# Patient Record
Sex: Female | Born: 1980 | Race: White | Hispanic: No | Marital: Single | Smoking: Current every day smoker
Health system: Southern US, Community
[De-identification: ages and names within clinical notes are randomized; demographics above are authoritative.]

## PROBLEM LIST (undated history)

## (undated) ENCOUNTER — Inpatient Hospital Stay: Payer: Self-pay

## (undated) DIAGNOSIS — Z6711 Type A blood, Rh negative: Secondary | ICD-10-CM

## (undated) DIAGNOSIS — R87611 Atypical squamous cells cannot exclude high grade squamous intraepithelial lesion on cytologic smear of cervix (ASC-H): Secondary | ICD-10-CM

## (undated) DIAGNOSIS — R87619 Unspecified abnormal cytological findings in specimens from cervix uteri: Secondary | ICD-10-CM

## (undated) DIAGNOSIS — Z6831 Body mass index (BMI) 31.0-31.9, adult: Secondary | ICD-10-CM

## (undated) DIAGNOSIS — D069 Carcinoma in situ of cervix, unspecified: Secondary | ICD-10-CM

## (undated) HISTORY — DX: Unspecified abnormal cytological findings in specimens from cervix uteri: R87.619

## (undated) HISTORY — DX: Carcinoma in situ of cervix, unspecified: D06.9

## (undated) HISTORY — DX: Body mass index (BMI) 31.0-31.9, adult: Z68.31

## (undated) HISTORY — DX: Type A blood, Rh negative: Z67.11

## (undated) HISTORY — DX: Atypical squamous cells cannot exclude high grade squamous intraepithelial lesion on cytologic smear of cervix (ASC-H): R87.611

---

## 2006-05-17 ENCOUNTER — Ambulatory Visit: Payer: Self-pay | Admitting: Family Medicine

## 2006-05-27 ENCOUNTER — Ambulatory Visit: Payer: Self-pay | Admitting: Family Medicine

## 2006-11-04 IMAGING — US US THYROID
1 series · 18 of 25 positions shown · non-contrast
Comparison: none

REASON FOR EXAM: hypomegaly
COMMENTS:

[Series 1: us thyroid · 18 of 28 slices shown]
[im 1/28]
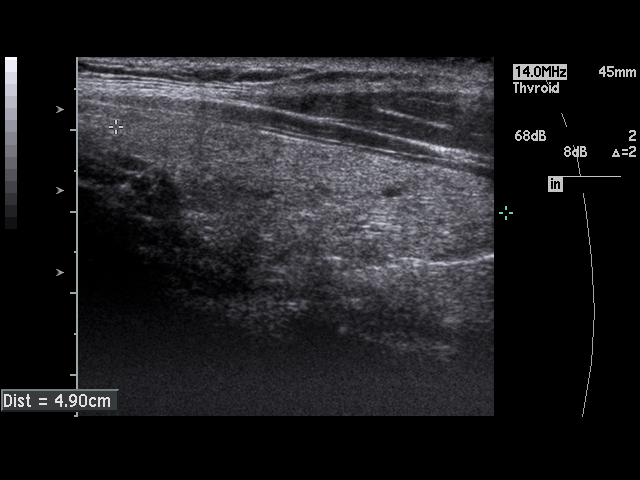
[im 3/28]
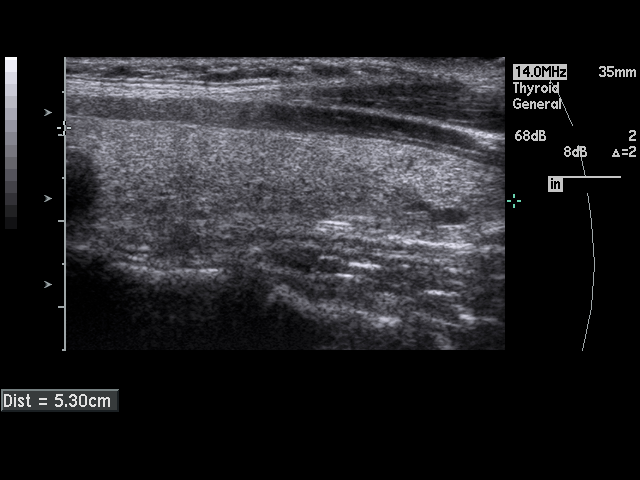
[im 4/28]
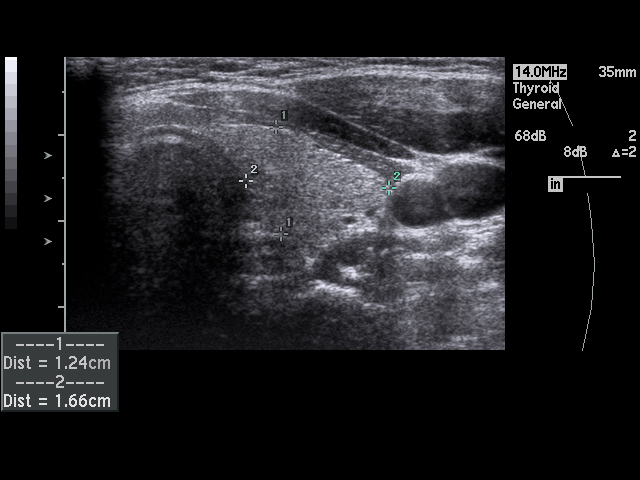
[im 5/28]
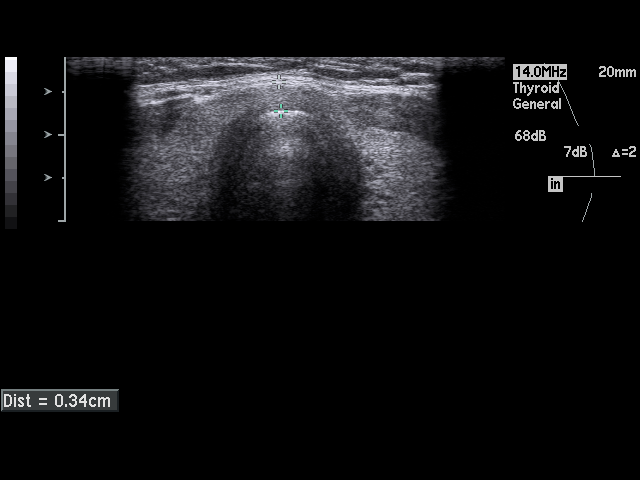
[im 7/28]
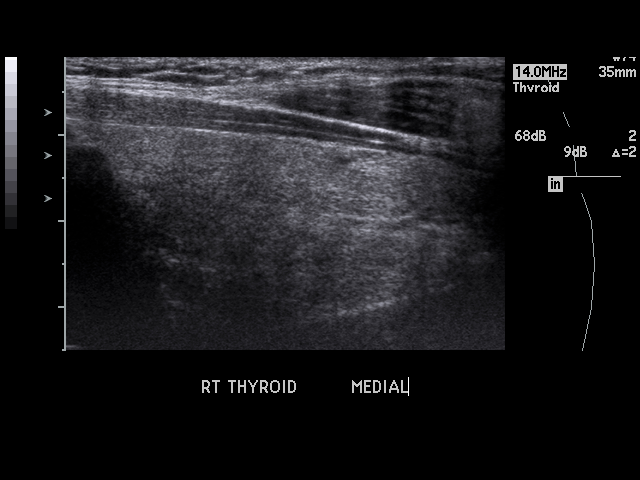
[im 8/28]
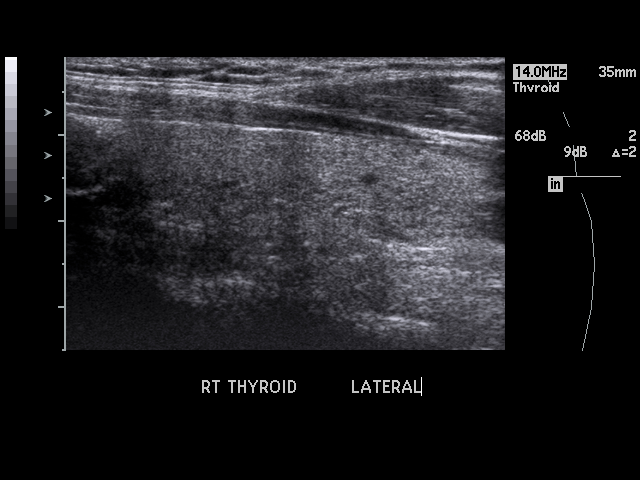
[im 11/28]
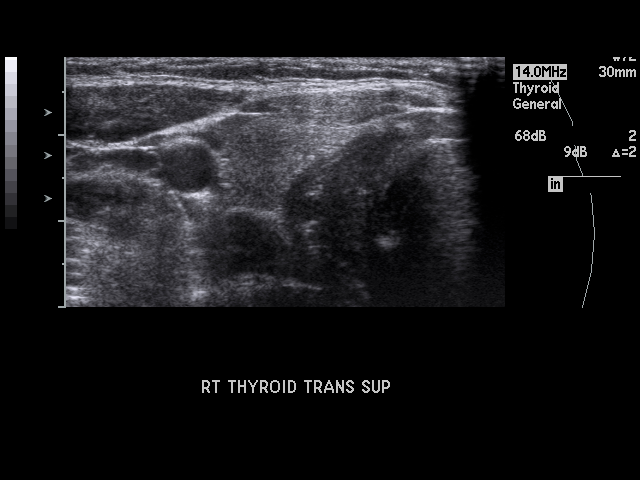
[im 12/28]
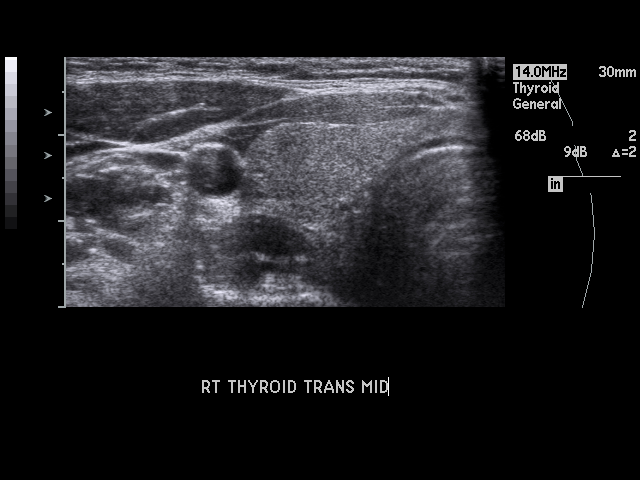
[im 13/28]
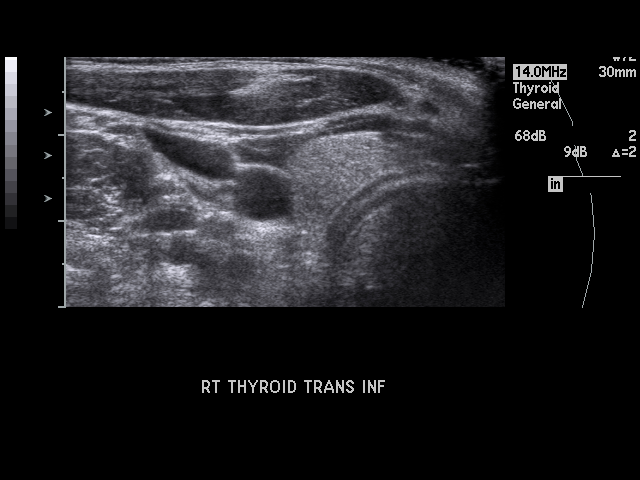
[im 15/28]
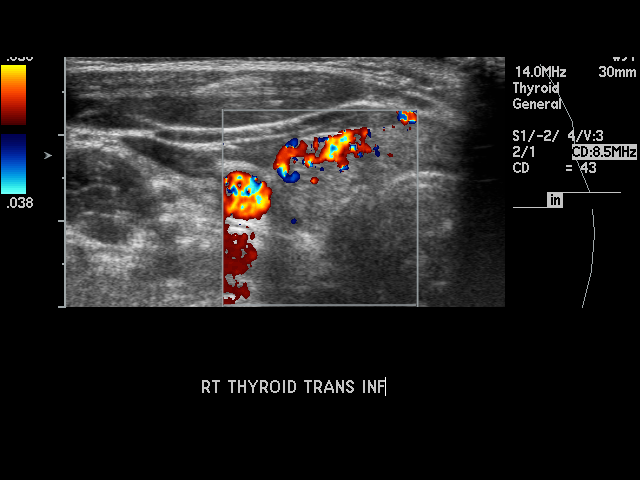
[im 16/28]
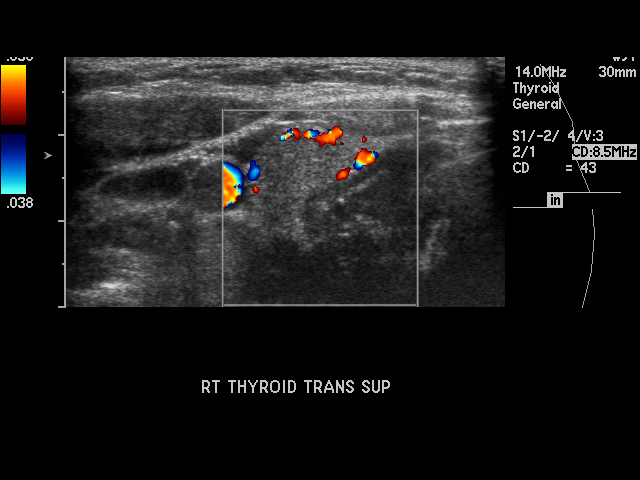
[im 17/28]
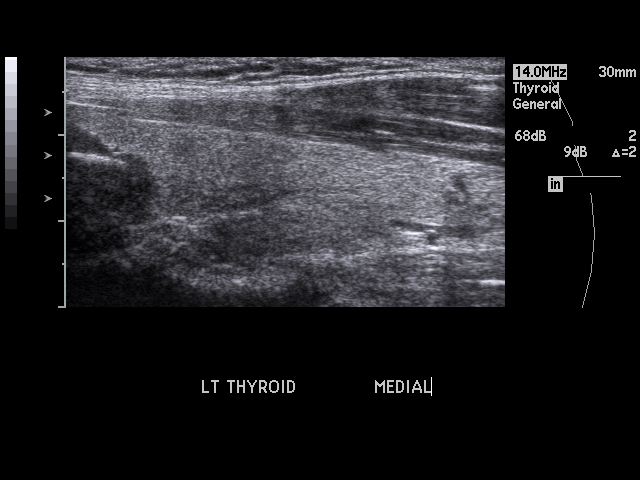
[im 20/28]
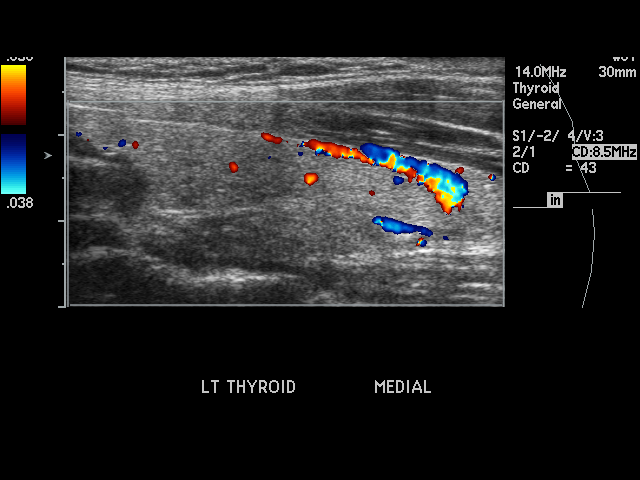
[im 21/28]
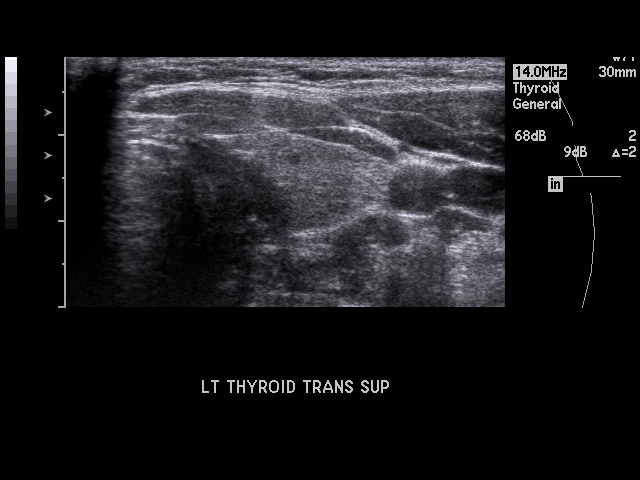
[im 23/28]
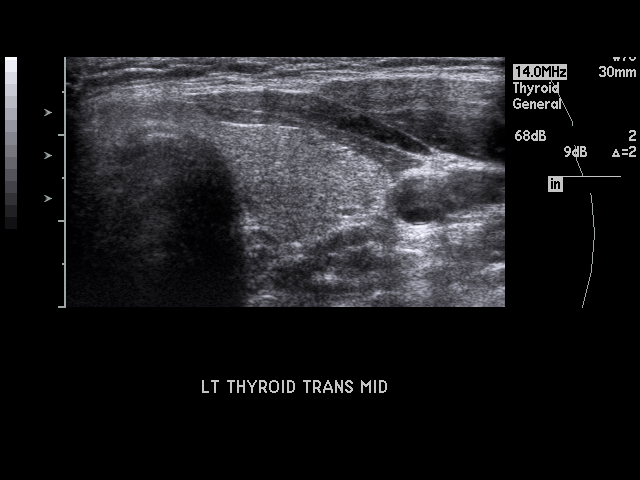
[im 24/28]
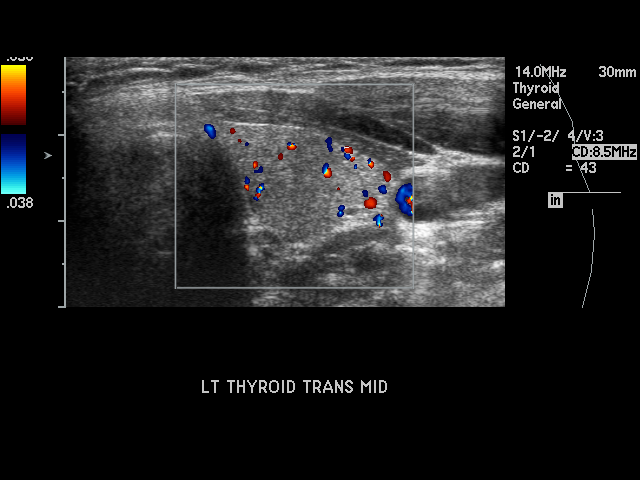
[im 25/28]
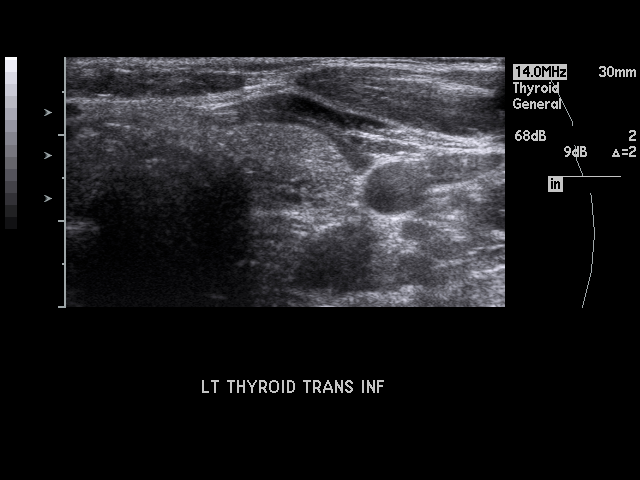
[im 28/28]
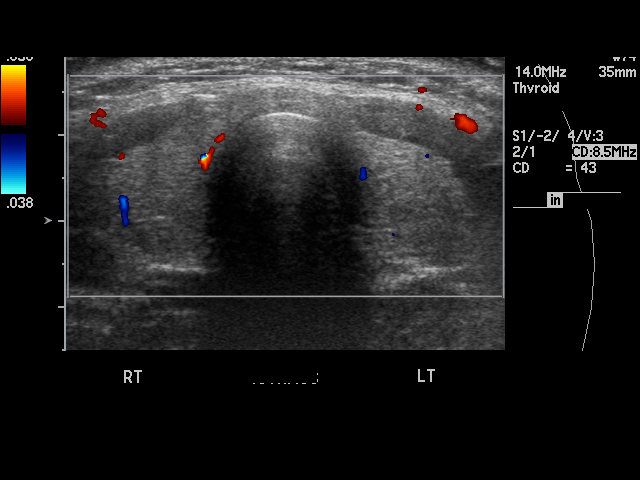

[18 of 25 positions shown; findings below may reference images not displayed]

PROCEDURE:     US  - US THYROID  - May 17, 2006 [DATE]

RESULT:          The RIGHT and LEFT thyroid lobes are normal in echotexture.
 The RIGHT thyroid lobe measures 4.9 x 1.4 x 1.5 cm.  The LEFT thyroid lobe
measures 5.3 x 1.2 x 1.7 cm.  No nodules in the isthmus were demonstrated.
The vascularity is normal in appearance.
IMPRESSION: Normal thyroid ultrasound.

## 2006-11-14 IMAGING — NM NM THYROID IMAGING W/ UPTAKE SINGLE (24 HR)
1 series · 3 of 3 positions shown · non-contrast
Comparison: none

REASON FOR EXAM: Hypomegaly
COMMENTS:

[Series 1: (id) thyroid scan · 2.40mm/px · 3 of 3 slices shown]
[im 1/3  full-range]
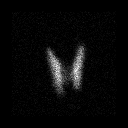
[im 2/3  full-range]
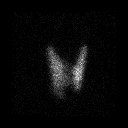
[im 3/3  full-range]
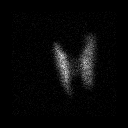

[3 of 3 positions shown; findings below may reference images not displayed]

PROCEDURE:     NM  - NM THYROID 2-MVO 24 HR [DATE]  [DATE]

RESULT:     Following oral administration of 144.5 microcuries radioactive
iodine 2-MVO, the six-hour uptake measured 14.5% and the 23-hour uptake
measured 24.9%.  These values are within the euthyroid range.

Thyroid scan was performed in the anterior and both oblique views.  The
thyroid lobes are symmetrical.  No hot or cold nodules are seen. There is
homogenous tracer activity in both lobes.
IMPRESSION: 1)Thyroid uptake values are in the euthyroid range.

2)Normal thyroid scan.

## 2011-02-16 ENCOUNTER — Inpatient Hospital Stay: Payer: Self-pay

## 2014-06-19 ENCOUNTER — Emergency Department: Payer: Self-pay | Admitting: Emergency Medicine

## 2014-06-19 LAB — URINALYSIS, COMPLETE
BACTERIA: NONE SEEN
Bilirubin,UR: NEGATIVE
GLUCOSE, UR: NEGATIVE mg/dL (ref 0–75)
Ketone: NEGATIVE
LEUKOCYTE ESTERASE: NEGATIVE
NITRITE: NEGATIVE
PH: 7 (ref 4.5–8.0)
PROTEIN: NEGATIVE
RBC,UR: 4 /HPF (ref 0–5)
Specific Gravity: 1.005 (ref 1.003–1.030)
Squamous Epithelial: 1
Transitional Epi: <1
WBC UR: <1 /HPF (ref 0–5)

## 2014-11-19 NOTE — L&D Delivery Note (Signed)
Delivery Note At 1:51 AM a viable female was delivered via Vaginal, Spontaneous Delivery (Presentation: Left Occiput Anterior).  APGAR: 8, 9; weight PENDING at time of this note.   Placenta status: Intact, Spontaneous.  Cord: 3 vessels with the following complications: .  Cord pH: not obtained  Anesthesia: Epidural  Episiotomy: None Lacerations: 1st degree Suture Repair: n/a Est. Blood Loss (mL): 450  Mom to postpartum.  Baby to Couplet care / Skin to Skin.  Called to see patient.  Mom pushed to delivery viable female infant.  The head followed by shoulders, which delivered without difficulty, and the rest of the body.  No nuchal cord noted.  Baby to mom's chest.  Cord clamped and cut after > 1 min delay.  Cord blood obtained.  Placenta delivered spontaneously, intact, with a 3-vessel cord.  Several small, first degree lacerations hemostatic without suture.  All counts correct.  Hemostasis obtained with IV pitocin and fundal massage. EBL 450 mL.    Conard Novak, MD 08/26/2015, 2:06 AM

## 2015-01-05 DIAGNOSIS — R87611 Atypical squamous cells cannot exclude high grade squamous intraepithelial lesion on cytologic smear of cervix (ASC-H): Secondary | ICD-10-CM

## 2015-01-05 HISTORY — DX: Atypical squamous cells cannot exclude high grade squamous intraepithelial lesion on cytologic smear of cervix (ASC-H): R87.611

## 2015-01-05 LAB — HM PAP SMEAR: HM Pap smear: POSITIVE

## 2015-01-17 HISTORY — PX: COLPOSCOPY: SHX161

## 2015-01-18 ENCOUNTER — Ambulatory Visit
Admit: 2015-01-18 | Disposition: A | Discharge status: Home / Self Care | Payer: Self-pay | Attending: Obstetrics and Gynecology | Admitting: Obstetrics and Gynecology

## 2015-01-25 ENCOUNTER — Ambulatory Visit
Admit: 2015-01-25 | Disposition: A | Discharge status: Home / Self Care | Payer: Self-pay | Attending: Obstetrics and Gynecology | Admitting: Obstetrics and Gynecology

## 2015-02-18 ENCOUNTER — Ambulatory Visit
Admit: 2015-02-18 | Disposition: A | Discharge status: Home / Self Care | Payer: Self-pay | Attending: Obstetrics and Gynecology | Admitting: Obstetrics and Gynecology

## 2015-05-11 ENCOUNTER — Ambulatory Visit: Payer: Self-pay

## 2015-06-01 ENCOUNTER — Other Ambulatory Visit: Payer: Self-pay | Admitting: *Deleted

## 2015-06-01 ENCOUNTER — Inpatient Hospital Stay: Payer: Managed Care, Other (non HMO) | Attending: Obstetrics and Gynecology | Admitting: Obstetrics and Gynecology

## 2015-06-01 VITALS — BP 116/72 | HR 105 | Temp 98.0°F | Resp 18 | Ht 67.0 in | Wt 198.0 lb

## 2015-06-01 DIAGNOSIS — R87613 High grade squamous intraepithelial lesion on cytologic smear of cervix (HGSIL): Secondary | ICD-10-CM

## 2015-06-01 DIAGNOSIS — Z3A28 28 weeks gestation of pregnancy: Secondary | ICD-10-CM | POA: Insufficient documentation

## 2015-06-01 DIAGNOSIS — F17210 Nicotine dependence, cigarettes, uncomplicated: Secondary | ICD-10-CM | POA: Diagnosis not present

## 2015-06-01 NOTE — Progress Notes (Signed)
Gynecologic Oncology Interval Note  Referring Provider: Dr. Teresa Baldwin  Chief Concern: HSIL PAP in pregnancy  Subjective:  Jacqueline Baldwin is a 34 y.o. woman who presents today for continued surveillance for dysplasia.    Pap/Treatment History:  Jacqueline Baldwin is a pleasant female G3 P2002 at approximately 28 weeks based on her reported EDC of 08/22/2015. She is in today for evaluation of severe cervical dysplasia. No abnormal bleeding or discharge. No complications with her pregnancy.  The patient had an abnormal Pap demonstrating a ASC-H and HRHPV positive on 01/05/2015. She presented for colposcopy and biopsies on 01/17/2015 by Dr. 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 The immunostain for P 16 was strong with diffuse positivity and Ki-67 was high.  02/09/15 seen by Dr Jacqueline Baldwin.  Colposcopy was adequate with visualization of the transformation zone.  The cervix was patulous with ectropian present.  The was mosaicism present at 11-12 o'clock; 2-4 o'clock. No biopsies performed. Repeat PAP and colposcopy recommended in 3-4 months.   She smokes tobacco and has tried to cut back to one-quarter of a pack per day.  She did get 2 doses of Guardisil; 1 administered on 08/01/2011 and another on 10/01/2011.  Problem List: Patient Active Problem List   Diagnosis Date Noted  . HSIL (high grade squamous intraepithelial lesion) on Pap smear of cervix 06/01/2015    Past Medical History: Past Medical History  Diagnosis Date  . Abnormal Pap smear of cervix     Past Surgical History: No past surgical history on file.  Family History: No family history on file.  Social History: History   Social History  . Marital Status: Single    Spouse Name: N/A  . Number of Children: N/A  . Years of Education: N/A   Occupational History  . Not on file.   Social History Main Topics  . Smoking status: Current Every Day Smoker -- 0.25 packs/day for 20 years   Types: Cigarettes  . Smokeless tobacco: Never Used  . Alcohol Use: No  . Drug Use: No  . Sexual Activity:    Partners: Male   Other Topics Concern  . Not on file   Social History Narrative  . No narrative on file    Allergies: Not on File  Current Medications: Current Outpatient Prescriptions  Medication Sig Dispense Refill  . Prenatal Vit-Fe Fumarate-FA (PRENATAL MULTIVITAMIN) TABS tablet Take 1 tablet by mouth daily at 12 noon.     No current facility-administered medications for this visit.   Review of Systems A comprehensive review of systems was negative.  Objective:  Physical Examination:  There were no vitals taken for this visit.  ECOG Performance Status: 0 - Asymptomatic  General appearance: alert, cooperative and appears stated age HEENT:PERRLA, sclera clear, anicteric, neck supple with midline trachea and thyroid without masses Lymph node survey: non-palpable, axillary, inguinal, supraclavicular Cardiovascular: regular rate and rhythm Respiratory: normal air entry, lungs clear to auscultation Abdomen: soft, non-tender, without masses or organomegaly, no hernias and well healed incision Back: inspection of back is normal Extremities: extremities normal, atraumatic, no cyanosis or edema Skin exam - normal coloration and turgor, no rashes, no suspicious skin lesions noted. Neurological exam reveals alert, oriented, normal speech, no focal findings or movement disorder noted.  Pelvic: exam chaperoned by nurse;  Vulva: normal appearing vulva with no masses, tenderness or lesions; There are some red irritated areas on the inner thighs consistent with yeast.  Vagina: normal vagina; Adnexa: normal adnexa in size, nontender and no  masses; Uterus: uterus is gravid and enlarged above the umbilicus; Cervix: ectropion noted.  No other visible or palpable lesions.   Rectal: not indicated  Colposcopy performed after consent obtained and time out.  The transformation zone was  well visualized and the exam was satisfactory.  There is mosaicism noted as on the last exam at11-12 o'clock; 2-4 o'clock.  No changes concerning for invasive cancer.      Lab Review Pap smear pending.  Assessment:  Jacqueline Baldwin is a 35 y.o. female G3P2 with a history of HGSIL in pregnancy. Now [redacted] weeks pregnant. Colposcopy exam today still consistent with CIN3.  No evidence of invasive cancer.   Plan:   Problem List Items Addressed This Visit      Genitourinary   HSIL (high grade squamous intraepithelial lesion) on Pap smear of cervix - Primary     Reassurance given. Follow up pap smear from today.  As long as this is normal suggest diagnostic/therapeutic LEEP 6 weeks postpartum, which is in about 4 months.  This could be performed by Dr Jacqueline Baldwin, but we could certainly do this as well. She is comfortable with the plan and had her questions answered.   Jacqueline Drown, MD  CC: Dr. Teresa Baldwin

## 2015-06-07 LAB — PAP LB AND HPV HIGH-RISK
HPV, high-risk: POSITIVE — AB
PAP SMEAR COMMENT: 0

## 2015-06-13 ENCOUNTER — Telehealth: Payer: Self-pay | Admitting: *Deleted

## 2015-06-13 NOTE — Telephone Encounter (Signed)
Cytology called. Patient has an HGSIL on pap. Calling to verify if results were reviewed. Called back to cytology on 06/13/15 with confirmation that the pap smear was received and reviewed by RN/MD

## 2015-06-13 NOTE — Telephone Encounter (Signed)
Notified patient of pap smear results. Pt pap is stable with HGSIL and high risk HPV. Has not changed since last pap smear result.  Reviewed treatment plan with patient. Dr. Tiburcio Pea will continue to monitor pap smear after delivery. Dr. Johnnette Litter recommended a LEEP after 6 weeks postpartum. Pt understands the treatment plan and will f/u at [facility] for her [facility] appointments. She understands that she can contact our office at any time with any questions or concerns. Pt expressed gratitude for the call.

## 2015-08-24 ENCOUNTER — Observation Stay
Admission: EM | Admit: 2015-08-24 | Discharge: 2015-08-24 | Disposition: A | Discharge status: Home / Self Care | Payer: Managed Care, Other (non HMO) | Source: Home / Self Care | Attending: Obstetrics & Gynecology | Admitting: Obstetrics & Gynecology

## 2015-08-24 ENCOUNTER — Encounter: Payer: Self-pay | Admitting: *Deleted

## 2015-08-24 LAB — OB RESULTS CONSOLE HIV ANTIBODY (ROUTINE TESTING): HIV: NONREACTIVE

## 2015-08-24 LAB — OB RESULTS CONSOLE VARICELLA ZOSTER ANTIBODY, IGG: VARICELLA IGG: IMMUNE

## 2015-08-24 LAB — OB RESULTS CONSOLE HEPATITIS B SURFACE ANTIGEN: Hepatitis B Surface Ag: NEGATIVE

## 2015-08-24 LAB — OB RESULTS CONSOLE RPR: RPR: NONREACTIVE

## 2015-08-24 LAB — OB RESULTS CONSOLE RUBELLA ANTIBODY, IGM: RUBELLA: IMMUNE

## 2015-08-24 LAB — OB RESULTS CONSOLE GC/CHLAMYDIA
Chlamydia: NEGATIVE
GC PROBE AMP, GENITAL: NEGATIVE

## 2015-08-24 LAB — OB RESULTS CONSOLE ABO/RH: RH Type: NEGATIVE

## 2015-08-24 NOTE — OB Triage Note (Signed)
Pt presents with complaints of contractions starting at 0430 this morning and has progressed throughout day. Now 15-20 minutes apart, denies leakage of fluid or bleeding.

## 2015-08-24 NOTE — Discharge Summary (Signed)
Jacqueline Baldwin is a 34 y.o. female. She is at [redacted]w[redacted]d gestation.  Indication: rule out labor  S: Resting comfortably. Moderate pain with contractions, no VB. Active fetal movement.  O:  BP 110/80 mmHg  Resp 20  Ht  (1.702 m)  Wt 99.791 kg (220 lb)  BMI 34.45 kg/m2 Results for orders placed or performed during the hospital encounter of 08/24/15 (from the past 48 hour(s))  OB RESULTS CONSOLE Rubella Antibody   Collection Time: 08/24/15 12:00 AM  Result Value Ref Range   Rubella Immune   OB RESULTS CONSOLE GC/Chlamydia   Collection Time: 08/24/15 12:00 AM  Result Value Ref Range   Gonorrhea Negative    Chlamydia Negative   OB RESULTS CONSOLE RPR   Collection Time: 08/24/15 12:00 AM  Result Value Ref Range   RPR Nonreactive   OB RESULTS CONSOLE HIV antibody   Collection Time: 08/24/15 12:00 AM  Result Value Ref Range   HIV Non-reactive   OB RESULTS CONSOLE Varicella zoster antibody, IgG   Collection Time: 08/24/15 12:00 AM  Result Value Ref Range   Varicella Immune   OB RESULTS CONSOLE Hepatitis B surface antigen   Collection Time: 08/24/15 12:00 AM  Result Value Ref Range   Hepatitis B Surface Ag Negative   OB RESULTS CONSOLE ABO/Rh   Collection Time: 08/24/15 12:00 AM  Result Value Ref Range   RH Type  Negative    ABO Grouping A      Gen: NAD, AAOx3      Abd: FNTTP      Ext: Non-tender, Nonedmeatous    FHT: 130 mod + accels no decels TOCO: quiet SVE: 4/80/-2, recheck same   A/P:  33yo G2P0020 @ 40.2 with rule out labor.   Labor: not present. .   Fetal Wellbeing: Reassuring Cat 1 tracing.  D/c home stable, precautions reviewed, follow-up as scheduled.   ----- Ranae Plumber, MD Attending Obstetrician and Gynecologist [facility]

## 2015-08-25 ENCOUNTER — Inpatient Hospital Stay: Payer: Managed Care, Other (non HMO) | Admitting: Registered Nurse

## 2015-08-25 ENCOUNTER — Inpatient Hospital Stay
Admission: EM | Admit: 2015-08-25 | Discharge: 2015-08-27 | DRG: 775 | Disposition: A | Discharge status: Home / Self Care | Payer: Managed Care, Other (non HMO) | Attending: Obstetrics and Gynecology | Admitting: Obstetrics and Gynecology

## 2015-08-25 ENCOUNTER — Encounter: Payer: Self-pay | Admitting: Obstetrics and Gynecology

## 2015-08-25 DIAGNOSIS — O480 Post-term pregnancy: Principal | ICD-10-CM | POA: Diagnosis present

## 2015-08-25 DIAGNOSIS — Z3A40 40 weeks gestation of pregnancy: Secondary | ICD-10-CM

## 2015-08-25 DIAGNOSIS — Z6831 Body mass index (BMI) 31.0-31.9, adult: Secondary | ICD-10-CM | POA: Diagnosis not present

## 2015-08-25 DIAGNOSIS — O99213 Obesity complicating pregnancy, third trimester: Secondary | ICD-10-CM

## 2015-08-25 DIAGNOSIS — F17210 Nicotine dependence, cigarettes, uncomplicated: Secondary | ICD-10-CM | POA: Diagnosis present

## 2015-08-25 DIAGNOSIS — O99334 Smoking (tobacco) complicating childbirth: Secondary | ICD-10-CM | POA: Diagnosis present

## 2015-08-25 DIAGNOSIS — F17200 Nicotine dependence, unspecified, uncomplicated: Secondary | ICD-10-CM

## 2015-08-25 DIAGNOSIS — O3443 Maternal care for other abnormalities of cervix, third trimester: Secondary | ICD-10-CM | POA: Diagnosis present

## 2015-08-25 DIAGNOSIS — O99214 Obesity complicating childbirth: Secondary | ICD-10-CM | POA: Diagnosis present

## 2015-08-25 DIAGNOSIS — D069 Carcinoma in situ of cervix, unspecified: Secondary | ICD-10-CM | POA: Diagnosis present

## 2015-08-25 DIAGNOSIS — E669 Obesity, unspecified: Secondary | ICD-10-CM | POA: Diagnosis present

## 2015-08-25 DIAGNOSIS — O0993 Supervision of high risk pregnancy, unspecified, third trimester: Secondary | ICD-10-CM

## 2015-08-25 LAB — CBC
HEMATOCRIT: 35.8 % (ref 35.0–47.0)
Hemoglobin: 12.6 g/dL (ref 12.0–16.0)
MCH: 31.2 pg (ref 26.0–34.0)
MCHC: 35.1 g/dL (ref 32.0–36.0)
MCV: 88.7 fL (ref 80.0–100.0)
PLATELETS: 218 10*3/uL (ref 150–440)
RBC: 4.03 MIL/uL (ref 3.80–5.20)
RDW: 14.3 % (ref 11.5–14.5)
WBC: 14.1 10*3/uL — AB (ref 3.6–11.0)

## 2015-08-25 LAB — RAPID HIV SCREEN (HIV 1/2 AB+AG)
HIV 1/2 Antibodies: NONREACTIVE
HIV-1 P24 Antigen - HIV24: NONREACTIVE

## 2015-08-25 LAB — ABO/RH: ABO/RH(D): A NEG

## 2015-08-25 LAB — TYPE AND SCREEN
ABO/RH(D): A NEG
ANTIBODY SCREEN: NEGATIVE

## 2015-08-25 MED ORDER — BUPIVACAINE HCL (PF) 0.25 % IJ SOLN
INTRAMUSCULAR | Status: DC | PRN
  Administered 2015-08-25: 2.5 mL via EPIDURAL
  Administered 2015-08-25 (×2): 4 mL via EPIDURAL
  Administered 2015-08-25 – 2015-08-26 (×3): 2.5 mL via EPIDURAL

## 2015-08-25 MED ORDER — OXYTOCIN 40 UNITS IN LACTATED RINGERS INFUSION - SIMPLE MED
1.0000 m[IU]/min | INTRAVENOUS | Status: DC
  Administered 2015-08-25: 5 m[IU]/min via INTRAVENOUS
  Administered 2015-08-25: 1 m[IU]/min via INTRAVENOUS

## 2015-08-25 MED ORDER — LIDOCAINE-EPINEPHRINE (PF) 1.5 %-1:200000 IJ SOLN
INTRAMUSCULAR | Status: DC | PRN
  Administered 2015-08-25: 3 mL via EPIDURAL

## 2015-08-25 MED ORDER — LACTATED RINGERS IV SOLN
INTRAVENOUS | Status: DC
  Administered 2015-08-25 (×2): via INTRAVENOUS

## 2015-08-25 MED ORDER — LACTATED RINGERS IV SOLN: 500.0000 mL | INTRAVENOUS | Status: DC | PRN

## 2015-08-25 MED ORDER — OXYTOCIN 10 UNIT/ML IJ SOLN: 10.0000 [IU] | Freq: Once | INTRAMUSCULAR | Status: DC

## 2015-08-25 MED ORDER — MISOPROSTOL 200 MCG PO TABS
ORAL_TABLET | ORAL | Status: AC
  Filled 2015-08-25: qty 4

## 2015-08-25 MED ORDER — ONDANSETRON HCL 4 MG/2ML IJ SOLN: 4.0000 mg | Freq: Four times a day (QID) | INTRAMUSCULAR | Status: DC | PRN

## 2015-08-25 MED ORDER — OXYTOCIN 10 UNIT/ML IJ SOLN
INTRAMUSCULAR | Status: AC
  Filled 2015-08-25: qty 2

## 2015-08-25 MED ORDER — LIDOCAINE HCL (PF) 1 % IJ SOLN: 30.0000 mL | INTRAMUSCULAR | Status: DC | PRN

## 2015-08-25 MED ORDER — FENTANYL CITRATE (PF) 100 MCG/2ML IJ SOLN: 50.0000 ug | Freq: Once | INTRAMUSCULAR | Status: DC | PRN

## 2015-08-25 MED ORDER — TERBUTALINE SULFATE 1 MG/ML IJ SOLN: 0.2500 mg | Freq: Once | INTRAMUSCULAR | Status: DC | PRN

## 2015-08-25 MED ORDER — ALUM & MAG HYDROXIDE-SIMETH 200-200-20 MG/5ML PO SUSP: 30.0000 mL | ORAL | Status: DC | PRN

## 2015-08-25 MED ORDER — FENTANYL CITRATE (PF) 100 MCG/2ML IJ SOLN
INTRAMUSCULAR | Status: AC
  Filled 2015-08-25: qty 2

## 2015-08-25 MED ORDER — FENTANYL 2.5 MCG/ML W/ROPIVACAINE 0.2% IN NS 100 ML EPIDURAL INFUSION (ARMC-ANES)
EPIDURAL | Status: AC
  Administered 2015-08-25: 10 mL/h via EPIDURAL
  Filled 2015-08-25: qty 100

## 2015-08-25 MED ORDER — AMMONIA AROMATIC IN INHA
RESPIRATORY_TRACT | Status: AC
  Filled 2015-08-25: qty 10

## 2015-08-25 MED ORDER — ALUM & MAG HYDROXIDE-SIMETH 200-200-20 MG/5ML PO SUSP
30.0000 mL | Freq: Once | ORAL | Status: DC
  Filled 2015-08-25: qty 30

## 2015-08-25 MED ORDER — LIDOCAINE HCL (PF) 1 % IJ SOLN
INTRAMUSCULAR | Status: DC
Start: 2015-08-25 — End: 2015-08-26
  Filled 2015-08-25: qty 30

## 2015-08-25 MED ORDER — CITRIC ACID-SODIUM CITRATE 334-500 MG/5ML PO SOLN: 30.0000 mL | ORAL | Status: DC | PRN

## 2015-08-25 MED ORDER — OXYTOCIN 40 UNITS IN LACTATED RINGERS INFUSION - SIMPLE MED: 62.5000 mL/h | INTRAVENOUS | Status: DC

## 2015-08-25 MED ORDER — OXYTOCIN 40 UNITS IN LACTATED RINGERS INFUSION - SIMPLE MED
INTRAVENOUS | Status: AC
  Administered 2015-08-25: 5 m[IU]/min via INTRAVENOUS
  Filled 2015-08-25: qty 1000

## 2015-08-25 MED ORDER — OXYTOCIN BOLUS FROM INFUSION: 500.0000 mL | INTRAVENOUS | Status: DC

## 2015-08-25 NOTE — H&P (Signed)
OB History & Physical   History of Present Illness:  Chief Complaint: contractions  HPI:  Jacqueline Baldwin is a 34 y.o. G3P0 female at [redacted]w[redacted]d dated by an 8 week ultrasound.  Her pregnancy has been complicated by obesity with initial BMI of 31, ASC-H pap smear (CIN 3 on colposcopy), rhesus negative, and smoking 1/2 ppd throughout her pregnancy.    She reports contractions.   She denies leakage of fluid.   She denies vginal bleeding.   She reports fetal movement.    Maternal Medical History:   Past Medical History  Diagnosis Date  . Abnormal Pap smear of cervix    Past Surgical History:  History reviewed. No pertinent past surgical history.  Allergies: No Known Allergies  Prior to Admission medications   Medication Sig Start Date End Date Taking? Authorizing Provider  Prenatal Vit-Fe Fumarate-FA (PRENATAL MULTIVITAMIN) TABS tablet Take 1 tablet by mouth daily at 12 noon.    Historical Provider, MD    OB History  Gravida Para Term Preterm AB SAB TAB Ectopic Multiple Living  3             # Outcome Date GA Lbr Len/2nd Weight Sex Delivery Anes PTL Lv  3 Current           2 Gravida 01/29/11     Vag-Spont     1 Gravida 08/26/98     Vag-Spont         Prenatal care site: [facility]  Social History: She  reports that she has been smoking Cigarettes.  She has a 5 pack-year smoking history. She has never used smokeless tobacco. She reports that she does not drink alcohol or use illicit drugs.  Family History: family history is not on file.   Review of Systems: Negative x 10 systems reviewed except as noted in the HPI.    Physical Exam:  Vital Signs: BP 120/71 mmHg  Pulse 103  Temp(Src) 98.2 F (36.8 C) (Oral)  Resp 16 General: no acute distress.  HEENT: normocephalic, atraumatic Heart: regular rate & rhythm.  No murmurs/rubs/gallops Lungs: clear to auscultation bilaterally Abdomen: soft, gravid, non-tender;  EFW: 7.5-8 pounds Pelvic:   External: Normal external  female genitalia  Cervix: Dilation: 6.5 / Effacement (%): 80 / Station: -2  Extremities: non-tender, symmetric, no edema bilaterally.  DTRs: 2+  Neurologic: Alert & oriented x 3.    Pertinent Results:  Prenatal Labs: Blood type/Rh A negative  Antibody screen negative  Rubella Immune  RPR NR  HBsAg negative  HIV negative  GC negative  Chlamydia negative  Genetic screening Declined  1 hour GTT Early (74), 28 weeks = 121  3 hour GTT n/a  GBS negative on 07/27/15   Baseline FHR: 135 beats/min   Variability: moderate Accelerations: present   Decelerations: absent Contractions: present fr equency: 2 q 10 min at most Overall assessment: cat 1  Assessment:  Jacqueline Baldwin is a 34 y.o. G3P0 female at [redacted]w[redacted]d who presents in labor.  Diagnoses:  1) active labor 2) supervision of high-risk pregnancy 3) obesity in pregnancy, 3rd trimester 4) BMI 31 5) ASC-H pap smear with CIN 3 on biopsy 6) smoker (1/2 ppd)  Plan:  1. Admit to Labor & Delivery  2. CBC, T&S, Clrs, IVF 3. GBS negative.   4. Fetwal well-being: reassuring overall  Conard Novak, MD 08/25/2015 2:21 PM

## 2015-08-25 NOTE — Anesthesia Preprocedure Evaluation (Signed)
Anesthesia Evaluation  Patient identified by MRN, date of birth, ID band Patient awake    Reviewed: Allergy & Precautions, H&P , NPO status , Patient's Chart, lab work & pertinent test results  History of Anesthesia Complications Negative for: history of anesthetic complications  Airway Mallampati: II  TM Distance: >3 FB Neck ROM: full    Dental no notable dental hx.    Pulmonary Current Smoker,    Pulmonary exam normal        Cardiovascular negative cardio ROS Normal cardiovascular exam     Neuro/Psych negative neurological ROS  negative psych ROS   GI/Hepatic negative GI ROS, Neg liver ROS,   Endo/Other  negative endocrine ROS  Renal/GU negative Renal ROS  negative genitourinary   Musculoskeletal   Abdominal   Peds  Hematology negative hematology ROS (+)   Anesthesia Other Findings   Reproductive/Obstetrics (+) Pregnancy                             Anesthesia Physical Anesthesia Plan  ASA: II  Anesthesia Plan: Epidural   Post-op Pain Management:    Induction:   Airway Management Planned:   Additional Equipment:   Intra-op Plan:   Post-operative Plan:   Informed Consent: I have reviewed the patients History and Physical, chart, labs and discussed the procedure including the risks, benefits and alternatives for the proposed anesthesia with the patient or authorized representative who has indicated his/her understanding and acceptance.     Plan Discussed with: Anesthesiologist  Anesthesia Plan Comments:         Anesthesia Quick Evaluation

## 2015-08-25 NOTE — Progress Notes (Signed)
L&D Note    Subjective:  Comfortable w epidural  Objective:   Filed Vitals:   08/25/15 1805 08/25/15 1810 08/25/15 1815 08/25/15 1820  BP:      Pulse:      Temp:      TempSrc:      Resp:      SpO2: 99% 99% 100% 99%    Current Vital Signs 24h Vital Sign Ranges  T 98.2 F (36.8 C) Temp  Avg: 98.2 F (36.8 C)  Min: 98.2 F (36.8 C)  Max: 98.2 F (36.8 C)  BP (!) 87/31 mmHg BP  Min: 87/31  Max: 120/69  HR 81 Pulse  Avg: 89.5  Min: 77  Max: 106  RR 16 Resp  Avg: 18  Min: 16  Max: 20  SaO2 99 %   SpO2  Avg: 99.5 %  Min: 98 %  Max: 100 %      Gen: nad FHR: 125/mod var/+accels/no decels Toco: 2-3 q 10 min SVE: 7/80/-1, AROM clear fluid  Medications SCHEDULED MEDICATIONS  . ammonia      . lidocaine (PF)      . misoprostol      . oxytocin      . oxytocin  10 Units Intramuscular Once  . oxytocin 40 units in LR 1000 mL        MEDICATION INFUSIONS  . lactated ringers 125 mL/hr at 08/25/15 1737  . oxytocin 40 units in LR 1000 mL    . oxytocin 40 units in LR 1000 mL      PRN MEDICATIONS  citric acid-sodium citrate, lactated ringers, lidocaine (PF), ondansetron   Assessment & Plan:  34 y.o. G3P2000 at [redacted]w[redacted]d with labor *Labor: AROM for clear fluid *Fetal Well-being: Cat 1, reassuring overall *GBS: neg *Analgesia: epidural * recheck in 2 hours or prn  Conard Novak, MD  08/25/2015 6:54 PM  [facility]

## 2015-08-25 NOTE — Anesthesia Procedure Notes (Signed)
Epidural Patient location during procedure: OB Start time: 08/25/2015 4:29 PM End time: 08/25/2015 4:32 PM  Staffing Resident/CRNA: Stormy Fabian  Preanesthetic Checklist Completed: patient identified, site marked, surgical consent, pre-op evaluation, timeout performed, IV checked, risks and benefits discussed and monitors and equipment checked  Epidural Patient position: sitting Prep: Betadine Patient monitoring: heart rate, continuous pulse ox and blood pressure Approach: midline Location: L4-L5 Injection technique: LOR air  Needle:  Needle type: Tuohy  Needle gauge: 18 G Needle length: 9 cm and 9 Needle insertion depth: 6 cm Catheter type: closed end flexible Catheter size: 20 Guage Catheter at skin depth: 11 cm Test dose: negative and 1.5% lidocaine with Epi 1:200 K  Assessment Sensory level: T10 Events: blood not aspirated, injection not painful, no injection resistance, negative IV test and no paresthesia  Additional Notes Pt's history reviewed and consent obtained as per OB consent Patient tolerated the insertion well without complications. Negative SATD, negative IVTD All VSS were obtained and monitored through OBIX and nursing protocols followed.Reason for block:procedure for pain

## 2015-08-25 NOTE — Progress Notes (Signed)
Patient ID: Jacqueline Baldwin, female   DOB: 05-Mar-1981, 34 y.o.   MRN: [id number] L&D Note    Subjective:  Comfortable w epidural (has been re-dosed once) Objective:   Filed Vitals:   08/25/15 2126 08/25/15 2218 08/25/15 2226 08/25/15 2326  BP: 118/68 125/54 129/67 113/72  Pulse: 96 89 86 87  Temp:  98.6 F (37 C)    TempSrc:      Resp:  18    SpO2:        Current Vital Signs 24h Vital Sign Ranges  T 98.6 F (37 C) Temp  Avg: 98.3 F (36.8 C)  Min: 98 F (36.7 C)  Max: 98.6 F (37 C)  BP 113/72 mmHg BP  Min: 87/31  Max: 129/67  HR 87 Pulse  Avg: 88.7  Min: 77  Max: 106  RR 18 Resp  Avg: 17.3  Min: 16  Max: 18  SaO2 100 %   SpO2  Avg: 99.5 %  Min: 98 %  Max: 100 %      Gen: nad FHR: 135/mod var/+accels/intermittent late decelerations (not frequent and followed by normal baseline with moderate variability and accelerations) Toco: 3 q 10 min SVE: 7/80/-1, IUPC placed (first IUPC returned frank blood, small amount). Replaced without issue with normal fluid return.  Medications SCHEDULED MEDICATIONS  . alum & mag hydroxide-simeth  30 mL Oral Once  . ammonia      . fentaNYL      . lidocaine (PF)      . misoprostol      . oxytocin      . oxytocin  10 Units Intramuscular Once  . oxytocin 40 units in LR 1000 mL        MEDICATION INFUSIONS  . lactated ringers 125 mL/hr at 08/25/15 1737  . oxytocin 40 units in LR 1000 mL    . oxytocin 40 units in LR 1000 mL      PRN MEDICATIONS  alum & mag hydroxide-simeth, citric acid-sodium citrate, fentaNYL (SUBLIMAZE) injection, lactated ringers, lidocaine (PF), ondansetron   Assessment & Plan:  34 y.o. G3P2000 at [redacted]w[redacted]d with labor, needing augmentation *Labor: start pitocin *Fetal Well-being: Cat 1, reassuring overall (will continue to monitor for more concerning tracings) *GBS: neg *Analgesia: epidural * recheck in 2 hours or prn  Conard Novak, MD  08/25/2015 11:36 PM  [facility]

## 2015-08-26 ENCOUNTER — Other Ambulatory Visit: Payer: Self-pay | Admitting: Obstetrics and Gynecology

## 2015-08-26 LAB — CBC
HEMATOCRIT: 35.5 % (ref 35.0–47.0)
Hemoglobin: 12.1 g/dL (ref 12.0–16.0)
MCH: 30.4 pg (ref 26.0–34.0)
MCHC: 34.2 g/dL (ref 32.0–36.0)
MCV: 89 fL (ref 80.0–100.0)
Platelets: 196 10*3/uL (ref 150–440)
RBC: 3.99 MIL/uL (ref 3.80–5.20)
RDW: 14.2 % (ref 11.5–14.5)
WBC: 18.4 10*3/uL — ABNORMAL HIGH (ref 3.6–11.0)

## 2015-08-26 LAB — RPR: RPR: NONREACTIVE

## 2015-08-26 MED ORDER — OXYTOCIN 40 UNITS IN LACTATED RINGERS INFUSION - SIMPLE MED: 62.5000 mL/h | INTRAVENOUS | Status: DC | PRN

## 2015-08-26 MED ORDER — BENZOCAINE-MENTHOL 20-0.5 % EX AERO
1.0000 "application " | INHALATION_SPRAY | CUTANEOUS | Status: DC | PRN
  Filled 2015-08-26: qty 56

## 2015-08-26 MED ORDER — NALBUPHINE HCL 10 MG/ML IJ SOLN: 5.0000 mg | INTRAMUSCULAR | Status: DC | PRN

## 2015-08-26 MED ORDER — SODIUM CHLORIDE 0.9 % IJ SOLN: 3.0000 mL | INTRAMUSCULAR | Status: DC | PRN

## 2015-08-26 MED ORDER — ONDANSETRON HCL 4 MG/2ML IJ SOLN: 4.0000 mg | INTRAMUSCULAR | Status: DC | PRN

## 2015-08-26 MED ORDER — SODIUM CHLORIDE 0.9 % IJ SOLN
3.0000 mL | Freq: Three times a day (TID) | INTRAMUSCULAR | Status: DC
  Administered 2015-08-26: 3 mL via INTRAVENOUS

## 2015-08-26 MED ORDER — DIPHENHYDRAMINE HCL 50 MG/ML IJ SOLN: 12.5000 mg | INTRAMUSCULAR | Status: DC | PRN

## 2015-08-26 MED ORDER — ONDANSETRON HCL 4 MG/2ML IJ SOLN: 4.0000 mg | Freq: Three times a day (TID) | INTRAMUSCULAR | Status: DC | PRN

## 2015-08-26 MED ORDER — DIPHENHYDRAMINE HCL 25 MG PO CAPS: 25.0000 mg | ORAL_CAPSULE | ORAL | Status: DC | PRN

## 2015-08-26 MED ORDER — IBUPROFEN 600 MG PO TABS
600.0000 mg | ORAL_TABLET | Freq: Four times a day (QID) | ORAL | Status: DC
  Administered 2015-08-26 – 2015-08-27 (×5): 600 mg via ORAL
  Filled 2015-08-26 (×5): qty 1

## 2015-08-26 MED ORDER — SIMETHICONE 80 MG PO CHEW: 80.0000 mg | CHEWABLE_TABLET | ORAL | Status: DC | PRN

## 2015-08-26 MED ORDER — ACETAMINOPHEN 325 MG PO TABS
650.0000 mg | ORAL_TABLET | ORAL | Status: DC | PRN
Start: 2015-08-26 — End: 2015-08-27

## 2015-08-26 MED ORDER — ONDANSETRON HCL 4 MG PO TABS: 4.0000 mg | ORAL_TABLET | ORAL | Status: DC | PRN

## 2015-08-26 MED ORDER — HYDROCODONE-ACETAMINOPHEN 5-325 MG PO TABS: 1.0000 | ORAL_TABLET | ORAL | Status: DC | PRN

## 2015-08-26 MED ORDER — KETOROLAC TROMETHAMINE 30 MG/ML IJ SOLN: 30.0000 mg | Freq: Four times a day (QID) | INTRAMUSCULAR | Status: AC | PRN

## 2015-08-26 MED ORDER — WITCH HAZEL-GLYCERIN EX PADS: 1.0000 "application " | MEDICATED_PAD | CUTANEOUS | Status: DC | PRN

## 2015-08-26 MED ORDER — NALBUPHINE HCL 10 MG/ML IJ SOLN: 5.0000 mg | Freq: Once | INTRAMUSCULAR | Status: DC | PRN

## 2015-08-26 MED ORDER — NALOXONE HCL 1 MG/ML IJ SOLN: 1.0000 ug/kg/h | INTRAVENOUS | Status: DC | PRN

## 2015-08-26 MED ORDER — LANOLIN HYDROUS EX OINT: TOPICAL_OINTMENT | CUTANEOUS | Status: DC | PRN

## 2015-08-26 MED ORDER — NALOXONE HCL 0.4 MG/ML IJ SOLN: 0.4000 mg | INTRAMUSCULAR | Status: DC | PRN

## 2015-08-26 MED ORDER — FENTANYL 2.5 MCG/ML W/ROPIVACAINE 0.2% IN NS 100 ML EPIDURAL INFUSION (ARMC-ANES): 12.0000 mL/h | EPIDURAL | Status: DC

## 2015-08-26 MED ORDER — DIBUCAINE 1 % RE OINT: 1.0000 "application " | TOPICAL_OINTMENT | RECTAL | Status: DC | PRN

## 2015-08-26 MED ORDER — HYDROCODONE-ACETAMINOPHEN 5-325 MG PO TABS
2.0000 | ORAL_TABLET | ORAL | Status: DC | PRN
  Administered 2015-08-26 – 2015-08-27 (×2): 2 via ORAL
  Filled 2015-08-26 (×2): qty 2

## 2015-08-26 MED ORDER — SENNOSIDES-DOCUSATE SODIUM 8.6-50 MG PO TABS: 2.0000 | ORAL_TABLET | ORAL | Status: DC

## 2015-08-26 MED ORDER — MEPERIDINE HCL 25 MG/ML IJ SOLN: 6.2500 mg | INTRAMUSCULAR | Status: DC | PRN

## 2015-08-26 MED ORDER — PRENATAL MULTIVITAMIN CH
1.0000 | ORAL_TABLET | Freq: Every day | ORAL | Status: DC
  Administered 2015-08-26 – 2015-08-27 (×2): 1 via ORAL
  Filled 2015-08-26: qty 1

## 2015-08-26 MED ORDER — FERROUS SULFATE 325 (65 FE) MG PO TABS
325.0000 mg | ORAL_TABLET | Freq: Two times a day (BID) | ORAL | Status: DC
  Administered 2015-08-26 – 2015-08-27 (×3): 325 mg via ORAL
  Filled 2015-08-26 (×2): qty 1

## 2015-08-26 MED ORDER — DIPHENHYDRAMINE HCL 25 MG PO CAPS: 25.0000 mg | ORAL_CAPSULE | Freq: Four times a day (QID) | ORAL | Status: DC | PRN

## 2015-08-26 NOTE — Progress Notes (Signed)
Admit Date: 08/25/2015 Today's Date: 08/26/2015  Post Partum Day 1  Subjective:  no complaints  Objective: Temp:  [97.8 F (36.6 C)-98.6 F (37 C)] 97.8 F (36.6 C) (10/07 0756) Pulse Rate:  [77-106] 77 (10/07 0756) Resp:  [16-20] 20 (10/07 0756) BP: (87-138)/(31-116) 120/75 mmHg (10/07 0756) SpO2:  [98 %-100 %] 99 % (10/07 0439) Weight:  [220 lb (99.791 kg)] 220 lb (99.791 kg) (10/07 0440)  Physical Exam:  General: alert and cooperative Lochia: appropriate Uterine Fundus: firm Incision: none DVT Evaluation: No evidence of DVT seen on physical exam.   Recent Labs  08/25/15 1444 08/26/15 0506  HGB 12.6 12.1  HCT 35.8 35.5    Assessment/Plan: Plan for discharge tomorrow, Breastfeeding, Lactation consult and Infant doing well   LOS: 1 day   Ugo Thoma [facility] 08/26/2015, 10:07 AM

## 2015-08-26 NOTE — Discharge Summary (Signed)
Obstetrical Discharge Summary  Date of Admission: 08/25/2015 Date of Discharge: 08/27/2015  Primary OB: [facility]  Gestational Age at Delivery: [redacted]w[redacted]d   Antepartum complications: Obesity with initial BMI of 31, ASC-H pap smear (CIN 3 on colposcopy), rhesus negative, and smoking 1/2 ppd throughout her pregnancy.  Reason for Admission: regular uterine contractions, cervical dilation of 6.5cm Date of Delivery:  08/26/2015  Delivered By: Thomasene Mohair, MD Delivery Type: spontaneous vaginal delivery Intrapartum complications/course: Patient admitted for labor. Augmented with AROM and eventually pitocin when did not progress after AROM. Routine SVD with small 1st degree lacerations.  Anesthesia: epidural Placenta: sponatneous Laceration: 1st degree vaginal Episiotomy: none Newborn Data: Live born female  Birth Weight:   APGAR: 8, 9   Discharge Physical Exam:  BP 121/69 mmHg  Pulse 81  Temp(Src) 98 F (36.7 C) (Oral)  Resp 18  Ht  (1.702 m)  Wt 99.791 kg (220 lb)  BMI 34.45 kg/m2  SpO2 95%  Breastfeeding? Unknown  General: NAD CV: RRR Pulm: CTABL, nl effort ABD: s/nd/nt, fundus firm and below the umbilicus Lochia: moderate DVT Evaluation: LE non-ttp, no evidence of DVT on exam.  HEMOGLOBIN  Date Value Ref Range Status  08/26/2015 12.1 12.0 - 16.0 g/dL Final   HCT  Date Value Ref Range Status  08/26/2015 35.5 35.0 - 47.0 % Final    Post partum course: Uncomplicated postpartum course Postpartum Procedures: influenza vaccination Disposition: stable, discharge to home.  Rh Immune globulin given: Yes  Information for the patient's newborn:  Jacqueline Baldwin, Blass [[id number]]  A POS    Rubella vaccine given: Immune Tdap vaccine given in APor PP setting: Given 06/15/15 Flu vaccine given in AP or PP setting: no  Contraception: tbd  Prenatal Labs:  Blood type/Rh A negative  Antibody screen negative  Rubella Immune  RPR NR  HBsAg negative   HIV negative  GC negative  Chlamydia negative  Genetic screening Declined  1 hour GTT Early (74), 28 weeks = 121  3 hour GTT n/a  GBS negative on 07/27/15        Plan:  Sofia Vanmeter was discharged to home in good condition. Follow-up appointment at [facility] with Dr Jean Rosenthal in 6 weeks.   Discharge Medications:   Medication List    TAKE these medications        ibuprofen 600 MG tablet  Commonly known as:  ADVIL,MOTRIN  Take 1 tablet (600 mg total) by mouth every 6 (six) hours.     prenatal multivitamin Tabs tablet  Take 1 tablet by mouth daily at 12 noon.        Signed: Vena Austria, MD

## 2015-08-27 MED ORDER — INFLUENZA VAC SPLIT QUAD 0.5 ML IM SUSY
0.5000 mL | PREFILLED_SYRINGE | INTRAMUSCULAR | Status: AC
  Administered 2015-08-27: 0.5 mL via INTRAMUSCULAR

## 2015-08-27 MED ORDER — RHO D IMMUNE GLOBULIN 1500 UNIT/2ML IJ SOSY
300.0000 ug | PREFILLED_SYRINGE | Freq: Once | INTRAMUSCULAR | Status: AC
  Administered 2015-08-27: 300 ug via INTRAMUSCULAR
  Filled 2015-08-27: qty 2

## 2015-08-27 MED ORDER — IBUPROFEN 600 MG PO TABS: 600.0000 mg | ORAL_TABLET | Freq: Four times a day (QID) | ORAL | Status: DC

## 2015-08-27 NOTE — Discharge Instructions (Signed)

## 2015-08-27 NOTE — Progress Notes (Signed)
All discharge instructions given to patient and she voices understanding of all instructions given.  She will make her own f/u appt. No prescriptions given. Pt discharge home with spouse and infant escorted out by cna in wheelchair.

## 2015-10-21 DIAGNOSIS — D069 Carcinoma in situ of cervix, unspecified: Secondary | ICD-10-CM | POA: Insufficient documentation

## 2015-10-21 HISTORY — PX: LEEP: SHX91

## 2015-11-25 HISTORY — PX: OTHER SURGICAL HISTORY: SHX169

## 2017-04-22 ENCOUNTER — Ambulatory Visit (INDEPENDENT_AMBULATORY_CARE_PROVIDER_SITE_OTHER): Payer: Commercial Managed Care - PPO | Admitting: Obstetrics and Gynecology

## 2017-04-22 ENCOUNTER — Encounter: Payer: Self-pay | Admitting: Obstetrics and Gynecology

## 2017-04-22 VITALS — BP 110/72 | HR 92 | Ht 67.0 in | Wt 202.0 lb

## 2017-04-22 DIAGNOSIS — E049 Nontoxic goiter, unspecified: Secondary | ICD-10-CM

## 2017-04-22 DIAGNOSIS — Z01419 Encounter for gynecological examination (general) (routine) without abnormal findings: Secondary | ICD-10-CM

## 2017-04-22 DIAGNOSIS — D069 Carcinoma in situ of cervix, unspecified: Secondary | ICD-10-CM | POA: Diagnosis not present

## 2017-04-22 DIAGNOSIS — Z124 Encounter for screening for malignant neoplasm of cervix: Secondary | ICD-10-CM

## 2017-04-22 LAB — HM PAP SMEAR: HM Pap smear: NORMAL

## 2017-04-22 NOTE — Progress Notes (Addendum)
Gynecology Annual Exam  PCP: Alba CorySowles, Krichna, MD  Chief Complaint  Patient presents with  . Gynecologic Exam    discuss smoking cessation    History of Present Illness:  Ms. Jani GravelBrandy Jimmerson is a 36 y.o. G3P3001 who LMP was Patient's last menstrual period was 04/12/2017., presents today for her annual examination.  Her menses are light and infrequent with Nexplanon.  She is single partner, contraception - Nexplanon.  Last Pap: abnormal, LEEP procedure in 10/2015, CIN 2,3 with clear margins. Hx of STDs: none  Last mammogram: n/a There is no FH of breast cancer. There is no FH of ovarian cancer. The patient does not do self-breast exams.  Tobacco use: yes. Alcohol use: social drinker Exercise: not active  The patient wears seatbelts: yes.      Review of Systems: Review of Systems  Constitutional: Negative.   HENT: Negative.   Eyes: Negative.   Respiratory: Negative.   Cardiovascular: Negative.   Gastrointestinal: Negative.   Genitourinary: Negative.   Musculoskeletal: Negative.   Skin: Negative.   Neurological: Negative.   Psychiatric/Behavioral: Negative.     Past Medical History:  Past Medical History:  Diagnosis Date  . Abnormal Pap smear of cervix   . Blood type A-   . BMI 31.0-31.9,adult   . CIN III (cervical intraepithelial neoplasia III)   . Pap smear of cervix with ASCUS, cannot exclude HGSIL 01/05/2015    Past Surgical History:  Past Surgical History:  Procedure Laterality Date  . COLPOSCOPY  01/17/2015   High grade squamous intraepithelial lesion   . LEEP  10/21/2015   Ectocervix, Leep pathology showed high-grade intraepithelial lesion, margins appear to be free  . nexplanon  11/25/2015    Medications: Prior to Admission medications   Medication Sig Start Date End Date Taking? Authorizing Provider  etonogestrel (NEXPLANON) 68 MG IMPL implant 1 each by Subdermal route once.   Yes [provider]    Allergies:  No Known  Allergies  Gynecologic History: Patient's last menstrual period was 04/12/2017.  Obstetric History: G3P3001  Social History:  Social History   Social History  . Marital status: Single    Spouse name: N/A  . Number of children: N/A  . Years of education: N/A   Occupational History  . Not on file.   Social History Main Topics  . Smoking status: Current Every Day Smoker    Packs/day: 0.25    Years: 20.00    Types: Cigarettes  . Smokeless tobacco: Never Used  . Alcohol use No  . Drug use: No  . Sexual activity: Yes    Partners: Male   Other Topics Concern  . Not on file   Social History Narrative  . No narrative on file    Family History:  Family History  Problem Relation Age of Onset  . Hyperlipidemia Mother   . Diabetes type II Paternal Grandmother      Physical Exam BP 110/72 (BP Location: Left Arm, Patient Position: Sitting, Cuff Size: Normal)   Pulse 92   Ht 5\' 7"  (1.702 m)   Wt 202 lb (91.6 kg)   LMP 04/12/2017   Breastfeeding? No   BMI 31.64 kg/m    Physical Exam  Constitutional: She is oriented to person, place, and time. She appears well-developed and well-nourished. No distress.  Genitourinary: Vagina normal and uterus normal. Pelvic exam was performed with patient supine. There is no rash, tenderness, lesion or injury on the right labia. There is no rash, tenderness, lesion or  injury on the left labia. Vagina exhibits no lesion. No erythema or bleeding in the vagina. No signs of injury around the vagina. Right adnexum does not display mass, does not display tenderness and does not display fullness. Left adnexum does not display mass, does not display tenderness and does not display fullness. Cervix does not exhibit motion tenderness, lesion or polyp.   Uterus is mobile and anteverted. Uterus is not enlarged, tender or exhibiting a mass.  HENT:  Head: Normocephalic and atraumatic.  Eyes: EOM are normal. No scleral icterus.  Neck: Normal range of  motion. Neck supple. No tracheal deviation present. Thyromegaly present.  Cardiovascular: Normal rate, regular rhythm and normal heart sounds.  Exam reveals no gallop and no friction rub.   No murmur heard. Pulmonary/Chest: Effort normal and breath sounds normal. No respiratory distress. She has no wheezes. She has no rales.  Abdominal: Soft. Bowel sounds are normal. She exhibits no distension and no mass. There is no tenderness. There is no rebound and no guarding. No hernia.  Musculoskeletal: Normal range of motion. She exhibits no edema.  Lymphadenopathy:    She has no cervical adenopathy.  Neurological: She is alert and oriented to person, place, and time. No cranial nerve deficit.  Skin: Skin is warm and dry. No erythema.  Psychiatric: She has a normal mood and affect. Her behavior is normal. Judgment normal.    Female chaperone present for pelvic and breast  portions of the physical exam  Assessment: 36 y.o. [id number] here for routine gynecologic examination, history of CIN 2,3.  Enlarged thyroid on exam.  Plan:  Screening: -- Blood pressure screen normal. -- Colonoscopy - not due -- Mammogram - not due -- Weight screening: obesity. Continue to monitor -- Depression screening negative (PHQ-9) -- Nutrition: normal -- cholesterol screening: n/a -- osteoporosis screening: n/a -- tobacco screening: using. Discussed quitting using 5 A's.  -- alcohol screening: AUDIT questionnaire indicates low-risk usage. -- family history of breast cancer screening: done. not at high risk. -- no evidence of domestic violence or intimate partner violence. -- STD screening: gonorrhea/chlamydia NAAT not collected per patient request. -- pap smear collected.  Enlarged thyroid: TSH/FT4. Consider ultrasound.   Thomasene Mohair, MD 04/22/2017 10:13 AM    ADDENDUM: Thyroid function tests normal. Ordered thyroid ultrasound.

## 2017-04-23 LAB — TSH+FREE T4
Free T4: 1.4 ng/dL (ref 0.82–1.77)
TSH: 0.871 u[IU]/mL (ref 0.450–4.500)

## 2017-04-25 ENCOUNTER — Encounter: Payer: Self-pay | Admitting: Obstetrics and Gynecology

## 2017-04-25 LAB — IGP, APTIMA HPV, RFX 16/18,45
HPV APTIMA: NEGATIVE
PAP Smear Comment: 0

## 2017-04-26 NOTE — Addendum Note (Signed)
Addended by: Thomasene MohairJACKSON, Bronnie Vasseur D on: 04/26/2017 08:05 AM   Modules accepted: Orders

## 2017-04-29 NOTE — Progress Notes (Signed)
[facility] is [facility] a [facility]. She is not in the old system either.

## 2017-04-30 ENCOUNTER — Ambulatory Visit
Admission: RE | Admit: 2017-04-30 | Discharge: 2017-04-30 | Disposition: A | Discharge status: Home / Self Care | Payer: Commercial Managed Care - PPO | Source: Ambulatory Visit | Attending: Obstetrics and Gynecology | Admitting: Obstetrics and Gynecology

## 2017-04-30 DIAGNOSIS — E042 Nontoxic multinodular goiter: Secondary | ICD-10-CM | POA: Insufficient documentation

## 2017-04-30 DIAGNOSIS — E049 Nontoxic goiter, unspecified: Secondary | ICD-10-CM | POA: Diagnosis present

## 2017-05-07 ENCOUNTER — Telehealth: Payer: Self-pay | Admitting: Obstetrics and Gynecology

## 2017-05-07 DIAGNOSIS — E049 Nontoxic goiter, unspecified: Secondary | ICD-10-CM | POA: Insufficient documentation

## 2017-05-07 NOTE — Telephone Encounter (Signed)
Spoke with patient. She apparently had a thyroid u/s in 2007 and this study is not greatly different from that one. However, I recommend we get the opinion of an endocrinologist in terms of any potential treatment and more likely how to follow this up.  She agreed to see an endocrinologist.  So, I will make the referral.

## 2018-08-08 ENCOUNTER — Ambulatory Visit: Payer: Commercial Managed Care - PPO | Admitting: Obstetrics and Gynecology

## 2018-08-18 ENCOUNTER — Other Ambulatory Visit (HOSPITAL_COMMUNITY)
Admission: RE | Admit: 2018-08-18 | Discharge: 2018-08-18 | Disposition: A | Discharge status: Home / Self Care | Payer: Commercial Managed Care - PPO | Source: Ambulatory Visit | Attending: Obstetrics and Gynecology | Admitting: Obstetrics and Gynecology

## 2018-08-18 ENCOUNTER — Encounter: Payer: Self-pay | Admitting: Obstetrics and Gynecology

## 2018-08-18 ENCOUNTER — Ambulatory Visit (INDEPENDENT_AMBULATORY_CARE_PROVIDER_SITE_OTHER): Payer: Commercial Managed Care - PPO | Admitting: Obstetrics and Gynecology

## 2018-08-18 ENCOUNTER — Telehealth: Payer: Self-pay | Admitting: Obstetrics and Gynecology

## 2018-08-18 VITALS — BP 124/74 | Ht 67.0 in | Wt 200.0 lb

## 2018-08-18 DIAGNOSIS — F17200 Nicotine dependence, unspecified, uncomplicated: Secondary | ICD-10-CM

## 2018-08-18 DIAGNOSIS — Z113 Encounter for screening for infections with a predominantly sexual mode of transmission: Secondary | ICD-10-CM | POA: Insufficient documentation

## 2018-08-18 DIAGNOSIS — F17210 Nicotine dependence, cigarettes, uncomplicated: Secondary | ICD-10-CM

## 2018-08-18 DIAGNOSIS — Z1331 Encounter for screening for depression: Secondary | ICD-10-CM

## 2018-08-18 DIAGNOSIS — Z01419 Encounter for gynecological examination (general) (routine) without abnormal findings: Secondary | ICD-10-CM | POA: Diagnosis not present

## 2018-08-18 DIAGNOSIS — Z124 Encounter for screening for malignant neoplasm of cervix: Secondary | ICD-10-CM

## 2018-08-18 DIAGNOSIS — Z1339 Encounter for screening examination for other mental health and behavioral disorders: Secondary | ICD-10-CM | POA: Diagnosis not present

## 2018-08-18 NOTE — Telephone Encounter (Signed)
Patient coming in on 12/17/18 with SDJ for nexplanon removal and reinsertion.

## 2018-08-18 NOTE — Progress Notes (Signed)
Gynecology Annual Exam  PCP: Alba Cory, MD  Chief Complaint  Patient presents with  . Annual Exam   History of Present Illness:  Ms. Jacqueline Baldwin is a 37 y.o. G3P3001 who LMP was Patient's last menstrual period was 08/11/2018., presents today for her annual examination.  Her menses are mostly monthly. She has a Nexplanon that was placed in 11/2015.   She is single partner, contraception - Nexplanon.  Last Pap: 6/4/2018Results were: NILM /neg HPV DNA negative Abnormal in past, LEEP procedure in 10/2015, CIN 2,3 with clear margins Hx of STDs: none  Last mammogram:  n/a There is no FH of breast cancer. There is no FH of ovarian cancer. The patient does not do self-breast exams.  Tobacco use: 1/2 ppd, she believes she is ready to quit.  . Alcohol use: social drinker Exercise: not active  The patient wears seatbelts: yes.      Past Medical History:  Diagnosis Date  . Abnormal Pap smear of cervix   . Blood type A-   . BMI 31.0-31.9,adult   . CIN III (cervical intraepithelial neoplasia III)   . Pap smear of cervix with ASCUS, cannot exclude HGSIL 01/05/2015    Past Surgical History:  Procedure Laterality Date  . COLPOSCOPY  01/17/2015   High grade squamous intraepithelial lesion   . LEEP  10/21/2015   Ectocervix, Leep pathology showed high-grade intraepithelial lesion, margins appear to be free  . nexplanon  11/25/2015    Prior to Admission medications   Medication Sig Start Date End Date Taking? Authorizing Provider  etonogestrel (NEXPLANON) 68 MG IMPL implant 1 each by Subdermal route once.    [provider]    No Known Allergies  Gynecologic History: Patient's last menstrual period was 08/11/2018.  Obstetric History: G3P3001  Social History   Socioeconomic History  . Marital status: Single    Spouse name: Not on file  . Number of children: Not on file  . Years of education: Not on file  . Highest education level: Not on file  Occupational  History  . Not on file  Social Needs  . Financial resource strain: Not on file  . Food insecurity:    Worry: Not on file    Inability: Not on file  . Transportation needs:    Medical: Not on file    Non-medical: Not on file  Tobacco Use  . Smoking status: Current Every Day Smoker    Packs/day: 0.25    Years: 20.00    Pack years: 5.00    Types: Cigarettes  . Smokeless tobacco: Never Used  Substance and Sexual Activity  . Alcohol use: No    Alcohol/week: 0.0 standard drinks  . Drug use: No  . Sexual activity: Yes    Partners: Male  Lifestyle  . Physical activity:    Days per week: Not on file    Minutes per session: Not on file  . Stress: Not on file  Relationships  . Social connections:    Talks on phone: Not on file    Gets together: Not on file    Attends religious service: Not on file    Active member of club or organization: Not on file    Attends meetings of clubs or organizations: Not on file    Relationship status: Not on file  . Intimate partner violence:    Fear of current or ex partner: Not on file    Emotionally abused: Not on file    Physically abused:  Not on file    Forced sexual activity: Not on file  Other Topics Concern  . Not on file  Social History Narrative  . Not on file    Family History  Problem Relation Age of Onset  . Hyperlipidemia Mother   . Diabetes type II Paternal Grandmother     Review of Systems  Constitutional: Negative.   HENT: Negative.   Eyes: Negative.   Respiratory: Negative.   Cardiovascular: Negative.   Gastrointestinal: Negative.   Genitourinary: Negative.   Musculoskeletal: Negative.   Skin: Negative.   Neurological: Negative.   Psychiatric/Behavioral: Negative.      Physical Exam BP 124/74   Ht 5\' 7"  (1.702 m)   Wt 200 lb (90.7 kg)   LMP 08/11/2018   BMI 31.32 kg/m    Physical Exam  Constitutional: She is oriented to person, place, and time. She appears well-developed and well-nourished. No distress.   Genitourinary: Uterus normal. Pelvic exam was performed with patient supine. There is no rash, tenderness, lesion or injury on the right labia. There is no rash, tenderness, lesion or injury on the left labia. No erythema, tenderness or bleeding in the vagina. No signs of injury around the vagina. No vaginal discharge found. Right adnexum does not display mass, does not display tenderness and does not display fullness. Left adnexum does not display mass, does not display tenderness and does not display fullness. Cervix does not exhibit motion tenderness, lesion, discharge or polyp.   Uterus is mobile and anteverted. Uterus is not enlarged, tender or exhibiting a mass.  HENT:  Head: Normocephalic and atraumatic.  Eyes: EOM are normal. No scleral icterus.  Neck: Normal range of motion. Neck supple. Thyromegaly present.  Cardiovascular: Normal rate and regular rhythm. Exam reveals no gallop and no friction rub.  No murmur heard. Pulmonary/Chest: Effort normal and breath sounds normal. No respiratory distress. She has no wheezes. She has no rales. Right breast exhibits no inverted nipple, no mass, no nipple discharge, no skin change and no tenderness. Left breast exhibits no inverted nipple, no mass, no nipple discharge, no skin change and no tenderness.  Abdominal: Soft. Bowel sounds are normal. She exhibits no distension and no mass. There is no tenderness. There is no rebound and no guarding.  Musculoskeletal: Normal range of motion. She exhibits no edema or tenderness.  Lymphadenopathy:    She has no cervical adenopathy.       Right: No inguinal adenopathy present.       Left: No inguinal adenopathy present.  Neurological: She is alert and oriented to person, place, and time. No cranial nerve deficit.  Skin: Skin is warm and dry. No rash noted. No erythema.  Psychiatric: She has a normal mood and affect. Her behavior is normal. Judgment normal.    Female chaperone present for pelvic and breast   portions of the physical exam  Results: AUDIT Questionnaire (screen for alcoholism): 2 PHQ-9: 2   Assessment: 37 y.o. G80P3001 female here for routine annual gynecologic examination.  Plan: Problem List Items Addressed This Visit      Other   Smoker    Other Visit Diagnoses    Women's annual routine gynecological examination    -  Primary   Relevant Orders   Cytology - PAP   Screening for depression       Screening for alcoholism       Pap smear for cervical cancer screening       Relevant Orders   Cytology -  PAP   Screen for STD (sexually transmitted disease)       Relevant Orders   Cytology - PAP      Screening: -- Blood pressure screen normal -- Colonoscopy - not due -- Mammogram - not due -- Weight screening: obese: discussed management options, including lifestyle, dietary, and exercise. -- Depression screening negative (PHQ-9) -- Nutrition: normal -- cholesterol screening: not due for screening -- osteoporosis screening: not due -- tobacco screening: using: discussed quitting using the 5 A's. She is strongly considering. Counseled regarding the various methods of quitting. She will consider Chantix.  Discussed its use and success rate.  She will consider. Discusseed making a plan with her. -- alcohol screening: AUDIT questionnaire indicates low-risk usage. -- family history of breast cancer screening: done. not at high risk. -- no evidence of domestic violence or intimate partner violence. -- STD screening: gonorrhea/chlamydia NAAT collected -- pap smear collected per ASCCP guidelines -- flu vaccine: declined -- HPV vaccination series: not eligilbe  Return in about 4 months (around 12/18/2018) for Nexplanon removal and insertion with Dr. Jean Rosenthal.   Thomasene Mohair, MD 08/18/2018 3:56 PM

## 2018-08-20 LAB — CYTOLOGY - PAP
Chlamydia: NEGATIVE
Diagnosis: NEGATIVE
HPV: NOT DETECTED
NEISSERIA GONORRHEA: NEGATIVE

## 2018-08-20 NOTE — Telephone Encounter (Signed)
Noted. Will order to arrive by apt date/time. 

## 2018-12-17 ENCOUNTER — Encounter: Payer: Self-pay | Admitting: Obstetrics and Gynecology

## 2018-12-17 ENCOUNTER — Ambulatory Visit (INDEPENDENT_AMBULATORY_CARE_PROVIDER_SITE_OTHER): Payer: Commercial Managed Care - PPO | Admitting: Obstetrics and Gynecology

## 2018-12-17 VITALS — BP 123/69 | HR 91 | Ht 67.0 in | Wt 206.0 lb

## 2018-12-17 DIAGNOSIS — Z3049 Encounter for surveillance of other contraceptives: Secondary | ICD-10-CM | POA: Diagnosis not present

## 2018-12-17 DIAGNOSIS — Z3046 Encounter for surveillance of implantable subdermal contraceptive: Secondary | ICD-10-CM

## 2018-12-17 DIAGNOSIS — Z30017 Encounter for initial prescription of implantable subdermal contraceptive: Secondary | ICD-10-CM

## 2018-12-17 NOTE — Progress Notes (Signed)
GYNECOLOGY PROCEDURE NOTE  Nexplanon removal discussed in detail.  Risks of infection, bleeding, nerve injury all reviewed.  Patient understands risks and desires to proceed.  Verbal consent obtained.  Patient is certain she wants the Nexplanon removed.  All questions answered.  Procedure: Patient placed in dorsal supine with left arm above head, elbow flexed at 90 degrees, arm resting on examination table.  Nexplanon identified without problems.  Betadine scrub x3.  1 ml of 1% lidocaine injected under Nexplanondevice without problems.  Sterile gloves applied.  Small 0.5cm incision made at distal tip of Nexplanon device with 11 blade scalpel.  Nexplanon brought to incision and grasped with a small kelly clamp.  Nexplanon removed intact without problems.     GYNECOLOGY PROCEDURE NOTE  Patient is a 38 y.o. G3P3001 presenting for Nexplanon insertion as her desires means of contraception.  She provided informed consent, signed copy in the chart, time out was performed. Pregnancy test was not done due immediately preceding removal of nexplanon, with self reported LMP of No LMP recorded. Patient has had an implant.  She understands that Nexplanon is a progesterone only therapy, and that patients often patients have irregular and unpredictable vaginal bleeding or amenorrhea. She understands that other side effects are possible related to systemic progesterone, including but not limited to, headaches, breast tenderness, nausea, and irritability. While effective at preventing pregnancy long acting reversible contraceptives do not prevent transmission of sexually transmitted diseases and use of barrier methods for this purpose was discussed. The placement procedure for Nexplanon was reviewed with the patient in detail including risks of nerve injury, infection, bleeding and injury to other muscles or tendons. She understands that the Nexplanon implant is good for 3 years and needs to be removed at the end  of that time.  She understands that Nexplanon is an extremely effective option for contraception, with failure rate of <1%. This information is reviewed today and all questions were answered. Informed consent was obtained, both verbally and written.   The patient is healthy and has no contraindications to Nexplanon use. Urine pregnancy test was not performed today.  We discussed the recommended site of insertion by the FDA. She felt very strongly about having a new site of scarring and new location of placement of the Nexplanon.  She states that she is willing to accept any risks with having the new device placed at the old location.  After a full discussion of risks, she still wished to proceed in this manner. Therefore, the insertion site was the same as her prior insertion site.   Procedure Appropriate time out taken.  Patient placed in dorsal supine with left arm already above head, elbow flexed at 90 degrees, arm resting on examination table with hand behind her head.  The insertion site was previously prepped with a two betadine swabs and then injected with 2 cc of 1% lidocaine without epinephrine.  Nexplanon removed form sterile blister packaging,  Device confirmed in needle, before inserting full length of needle, tenting up the skin as the needle was advance.  The drug eluting rod was then deployed by pulling back the slider per the manufactures recommendation.  The implant was palpable by the clinician as well as the patient.  The insertion site covered dressed with a 1/2" steri-strip before applying  a kerlex bandage pressure dressing..Minimal blood loss was noted during the procedure.  The patient tolerated the procedure well.   She was instructed to wear the bandage for 24 hours,  call with any signs of infection.  She was given the Nexplanon card and instructed to have the rod removed in 3 years.  Thomasene MohairStephen Lachlyn Vanderstelt, MD, Merlinda FrederickFACOG [facility], [facility] 12/17/2018 2:59 PM  i

## 2019-01-05 ENCOUNTER — Telehealth: Payer: Self-pay

## 2019-01-05 NOTE — Telephone Encounter (Signed)
Pt calling for date of last tetanus.  [phone]  Adv 06/15/15.

## 2019-04-14 IMAGING — US US THYROID
1 series · 13 of 25 positions shown · non-contrast
Comparison: 05/17/2006 ; nuclear medicine thyroid uptake and scan -
05/27/2006

CLINICAL DATA: Other.  Enlarged thyroid.

EXAM:
THYROID ULTRASOUND
TECHNIQUE: Ultrasound examination of the thyroid gland and adjacent soft
tissues was performed.

[Series 1: us thyroid · 0.07mm/px · 13 of 45 slices shown]
[im 1/45]
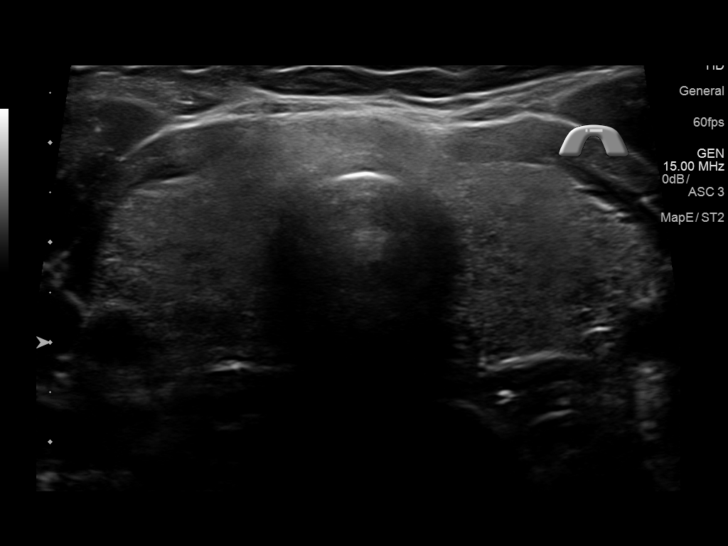
[im 4/45]
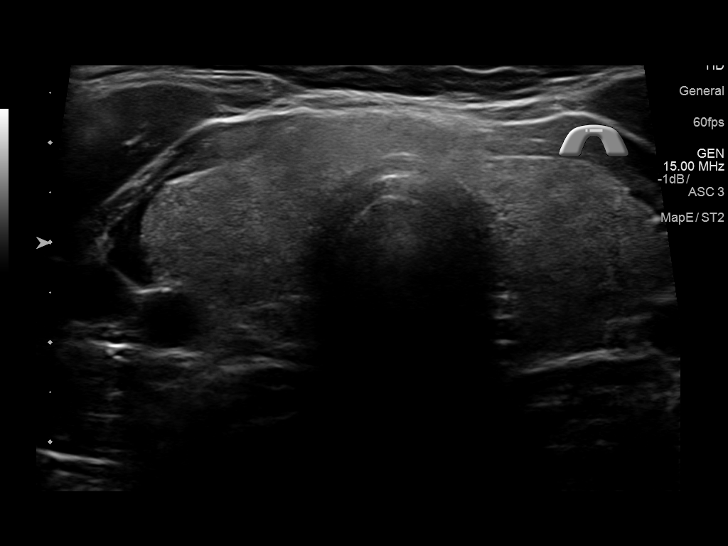
[im 8/45]
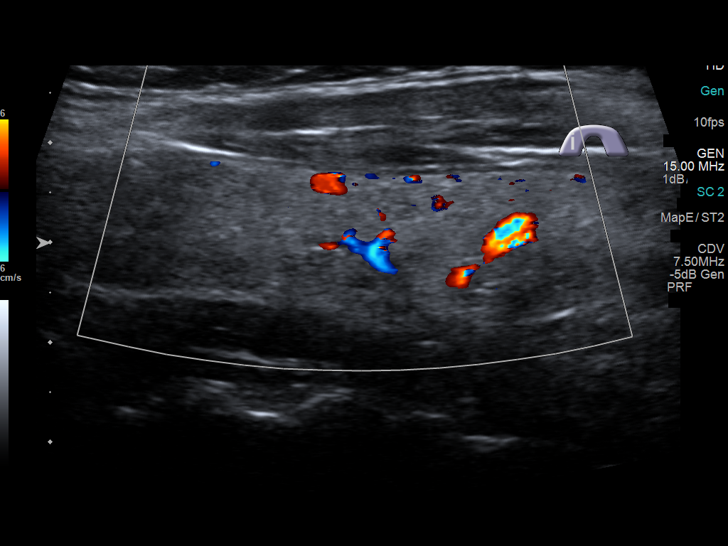
[im 12/45]
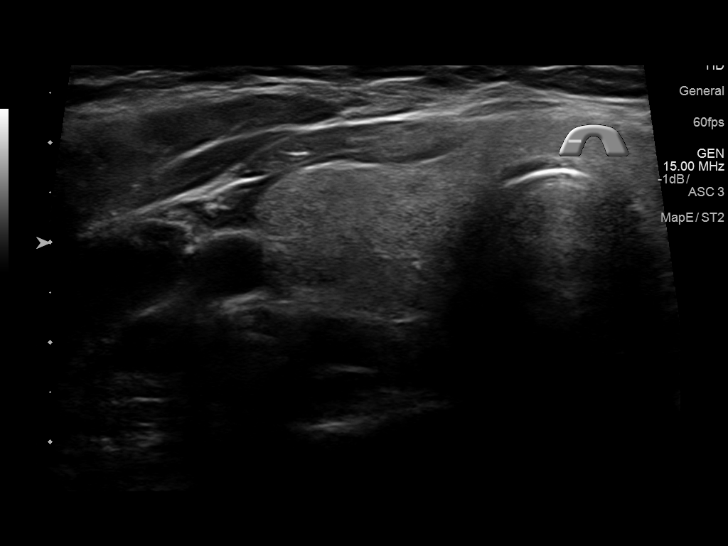
[im 15/45]
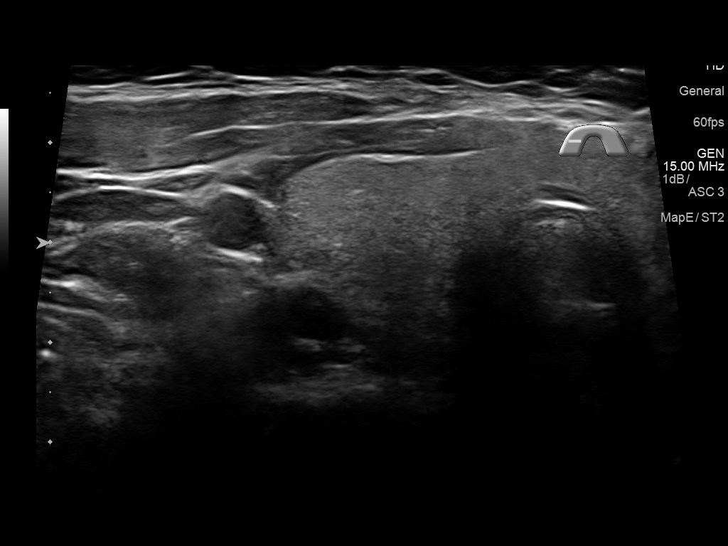
[im 19/45]
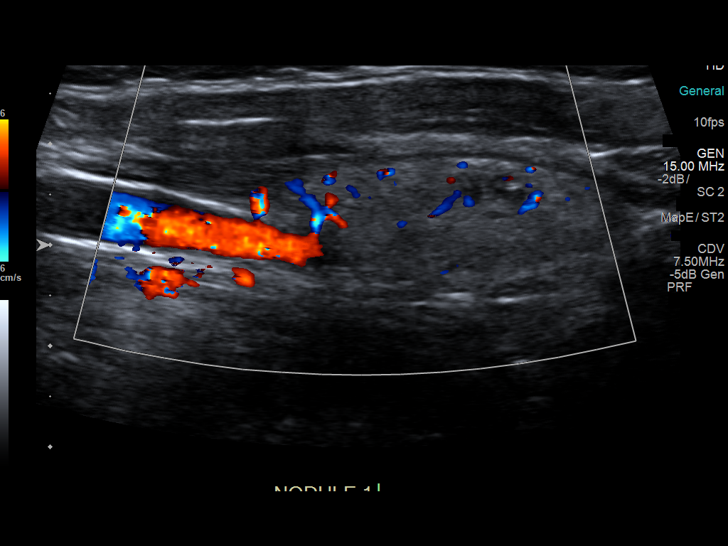
[im 23/45]
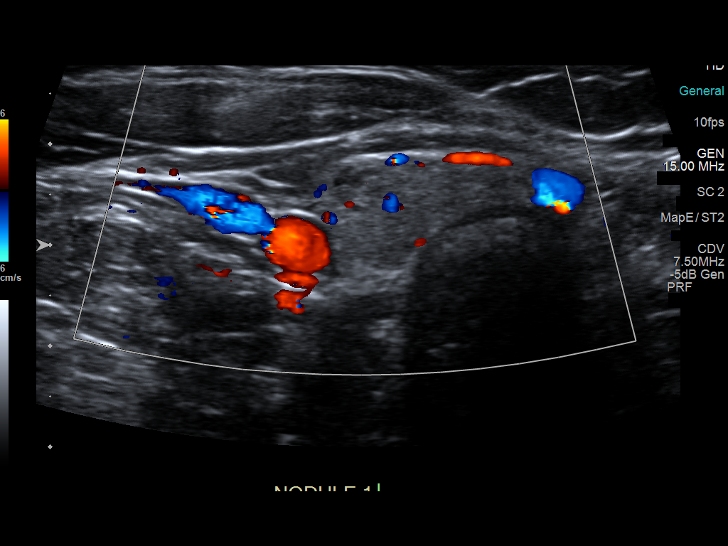
[im 26/45]
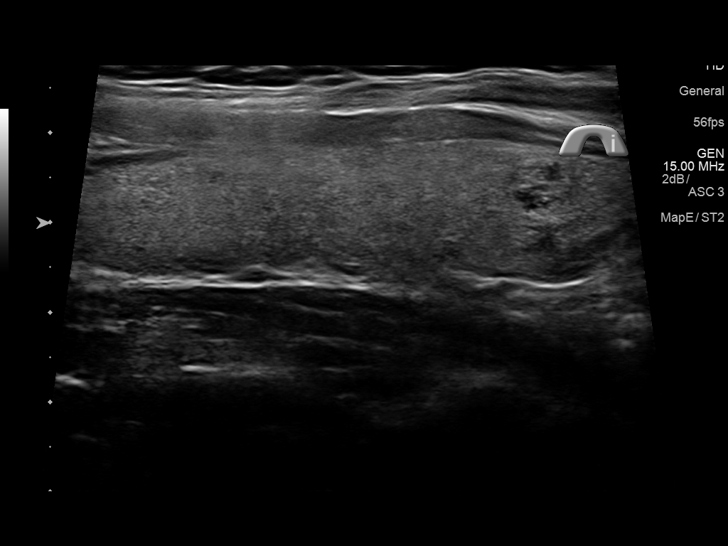
[im 30/45]
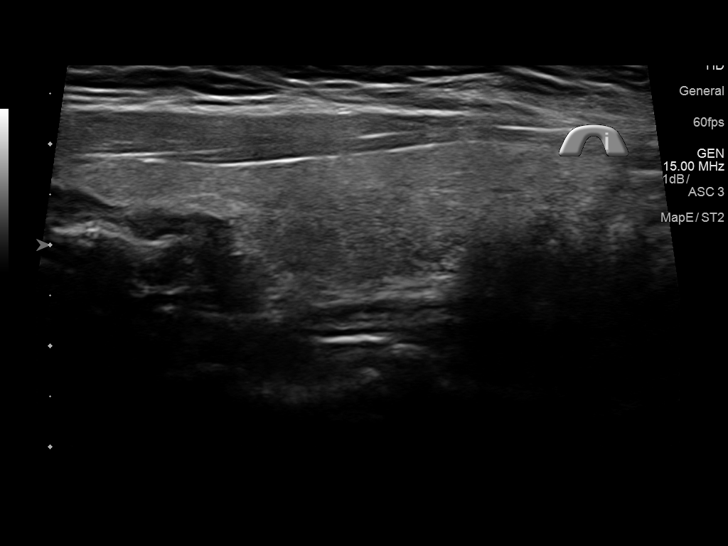
[im 34/45]
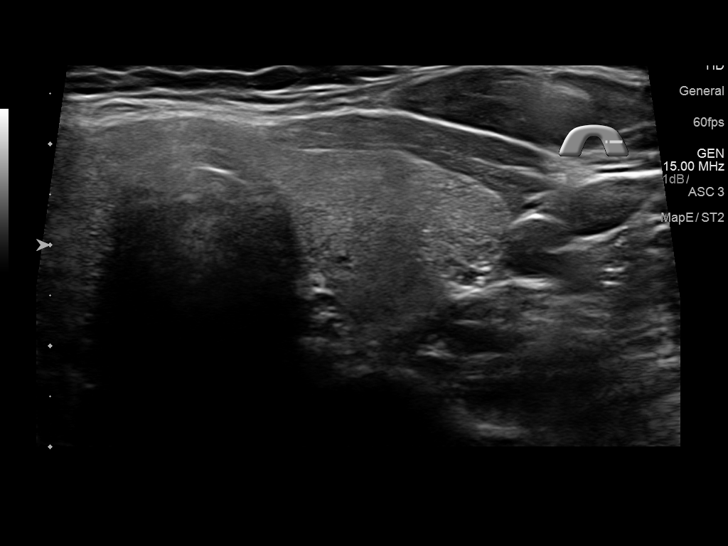
[im 37/45]
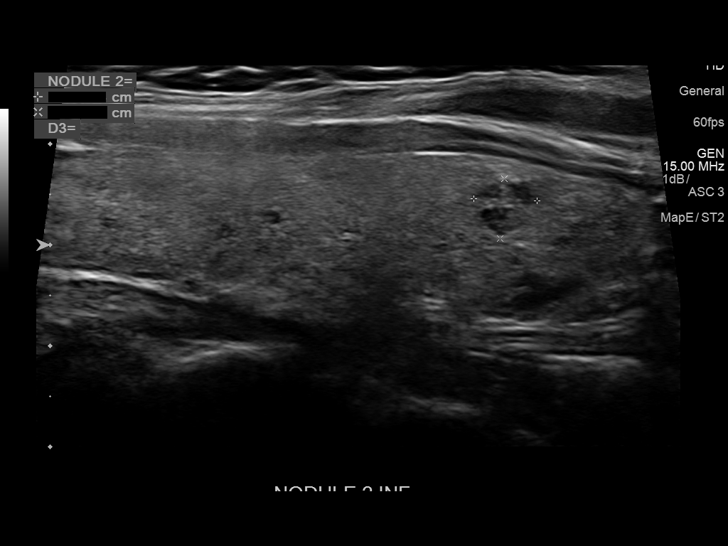
[im 41/45]
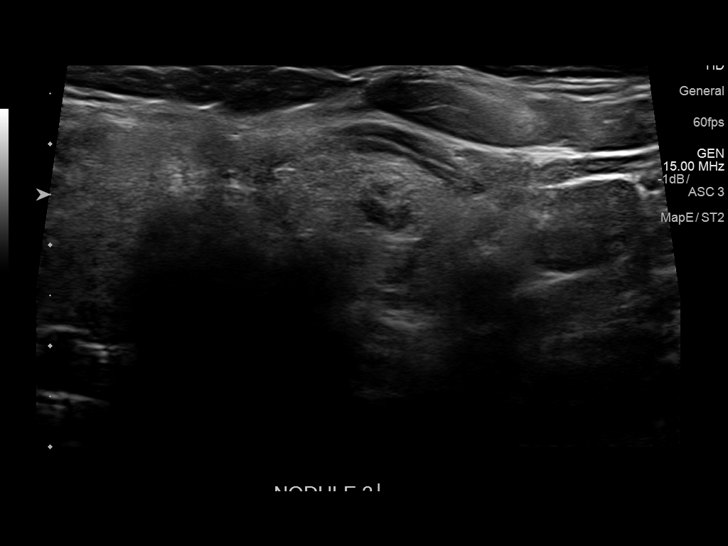
[im 45/45]
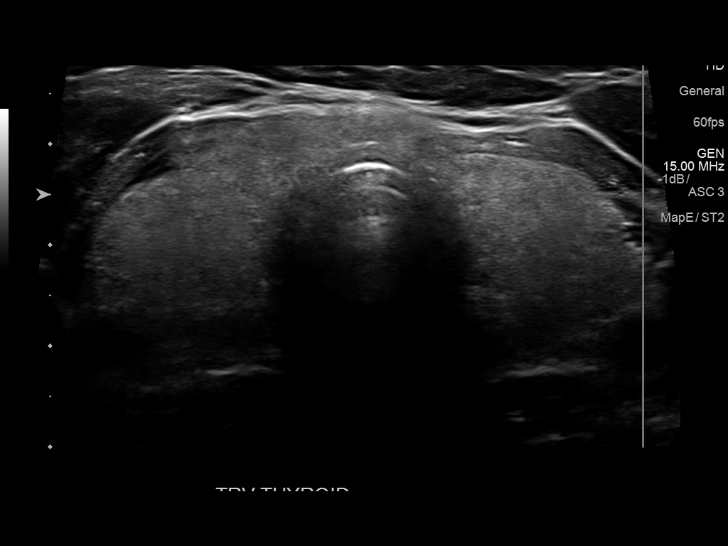

[13 of 25 positions shown; findings below may reference images not displayed]

FINDINGS: Parenchymal Echotexture: Mildly heterogenous

Isthmus: Normal in size measures 0.5 cm in diameter, unchanged,
previously, 0.3 cm

Right lobe: Enlarged measuring 6.1 x 1.8 x 2.0 cm, potentially
increased in size in interval, previously, 4.9 x 1.4 x 1.5 cm

Left lobe: Enlarged measuring 6.3 x 1.6 x 2.0 cm, potentially
minimally increased in size in interval, previously, 5.3 x 1.2 x
cm

_________________________________________________________

Estimated total number of nodules >/= 1 cm: 0

Number of spongiform nodules >/=  2 cm not described below (TR1): 0

Number of mixed cystic and solid nodules >/= 1.5 cm not described
below (TR2): 0

_________________________________________________________

There are punctate (sub 7 mm) spongiform appearing nodules within
both the right and left lobes of the thyroid, apparently new
compared to the [DATE] examination though neither nodule meets
imaging criteria to recommend percutaneous sampling or dedicated
follow-up.
IMPRESSION: 1. Nonspecific enlarged and heterogeneous appearing thyroid gland.
2. Punctate (sub 7 mm) benign appearing bilateral thyroid nodules
are new compared to the 7999 examination however neither meets
imaging criteria to recommend percutaneous sampling or dedicated
follow-up.
The above is in keeping with the ACR TI-RADS recommendations - [HOSPITAL] 9285;[DATE].

## 2019-08-10 ENCOUNTER — Ambulatory Visit: Payer: Commercial Managed Care - PPO | Admitting: Obstetrics and Gynecology

## 2019-08-20 ENCOUNTER — Encounter: Payer: Self-pay | Admitting: Obstetrics and Gynecology

## 2019-08-20 ENCOUNTER — Ambulatory Visit (INDEPENDENT_AMBULATORY_CARE_PROVIDER_SITE_OTHER): Payer: Commercial Managed Care - PPO | Admitting: Obstetrics and Gynecology

## 2019-08-20 ENCOUNTER — Other Ambulatory Visit: Payer: Self-pay

## 2019-08-20 VITALS — BP 100/66 | HR 90 | Ht 67.0 in | Wt 200.0 lb

## 2019-08-20 DIAGNOSIS — Z01419 Encounter for gynecological examination (general) (routine) without abnormal findings: Secondary | ICD-10-CM

## 2019-08-20 DIAGNOSIS — Z1331 Encounter for screening for depression: Secondary | ICD-10-CM

## 2019-08-20 DIAGNOSIS — Z1339 Encounter for screening examination for other mental health and behavioral disorders: Secondary | ICD-10-CM

## 2019-08-20 NOTE — Progress Notes (Signed)
Gynecology Annual Exam  PCP: Alba Cory, MD  Chief Complaint  Patient presents with  . Gynecologic Exam    No complaints   History of Present Illness:  Ms. Morayo Leven is a 38 y.o. G3P3001 who LMP was Patient's last menstrual period was 08/06/2019 (approximate)., presents today for her annual examination.  Her menses are mostly monthly. She has a Nexplanon that was placed in 11/2015.   She is single partner, contraception - Nexplanon. (last placed in 11/2018) Last Pap: 08/18/2018 - NILM/HPV negative 04/22/2017 Results were: NILM /neg HPV DNA negative Abnormal in past, LEEP procedure in 10/2015, CIN 2,3 with clear margins Hx of STDs: none  Last mammogram:  n/a There is no FH of breast cancer. There is no FH of ovarian cancer. The patient does not do self-breast exams.  Tobacco use: 1/2 ppd, she is not ready to quit Alcohol use: social drinker Exercise: not active  The patient wears seatbelts: yes.      Past Medical History:  Diagnosis Date  . Abnormal Pap smear of cervix   . Blood type A-   . BMI 31.0-31.9,adult   . CIN III (cervical intraepithelial neoplasia III)   . Pap smear of cervix with ASCUS, cannot exclude HGSIL 01/05/2015    Past Surgical History:  Procedure Laterality Date  . COLPOSCOPY  01/17/2015   High grade squamous intraepithelial lesion   . LEEP  10/21/2015   Ectocervix, Leep pathology showed high-grade intraepithelial lesion, margins appear to be free  . nexplanon  11/25/2015    Prior to Admission medications   Medication Sig Start Date End Date Taking? Authorizing Provider  etonogestrel (NEXPLANON) 68 MG IMPL implant 1 each by Subdermal route once.    [provider]    No Known Allergies  Gynecologic History: Patient's last menstrual period was 08/06/2019 (approximate).  Obstetric History: G3P3001  Social History   Socioeconomic History  . Marital status: Single    Spouse name: Not on file  . Number of children: Not on file   . Years of education: Not on file  . Highest education level: Not on file  Occupational History  . Not on file  Social Needs  . Financial resource strain: Not on file  . Food insecurity    Worry: Not on file    Inability: Not on file  . Transportation needs    Medical: Not on file    Non-medical: Not on file  Tobacco Use  . Smoking status: Current Every Day Smoker    Packs/day: 0.25    Years: 20.00    Pack years: 5.00    Types: Cigarettes  . Smokeless tobacco: Never Used  Substance and Sexual Activity  . Alcohol use: No    Alcohol/week: 0.0 standard drinks  . Drug use: No  . Sexual activity: Yes    Partners: Male    Birth control/protection: Implant  Lifestyle  . Physical activity    Days per week: 4 days    Minutes per session: 40 min  . Stress: Not on file  Relationships  . Social Musician on phone: Not on file    Gets together: Not on file    Attends religious service: Not on file    Active member of club or organization: Not on file    Attends meetings of clubs or organizations: Not on file    Relationship status: Not on file  . Intimate partner violence    Fear of current or ex partner:  Not on file    Emotionally abused: Not on file    Physically abused: Not on file    Forced sexual activity: Not on file  Other Topics Concern  . Not on file  Social History Narrative  . Not on file    Family History  Problem Relation Age of Onset  . Hyperlipidemia Mother   . Diabetes type II Paternal Grandmother   . Diabetes type II Father     Review of Systems  Constitutional: Negative.   HENT: Negative.   Eyes: Negative.   Respiratory: Negative.   Cardiovascular: Negative.   Gastrointestinal: Negative.   Genitourinary: Negative.   Musculoskeletal: Negative.   Skin: Negative.   Neurological: Negative.   Psychiatric/Behavioral: Negative.      Physical Exam BP 100/66 (BP Location: Left Arm, Patient Position: Sitting, Cuff Size: Normal)   Pulse  90   Ht 5\' 7"  (1.702 m)   Wt 200 lb (90.7 kg)   LMP 08/06/2019 (Approximate)   BMI 31.32 kg/m    Physical Exam Constitutional:      General: She is not in acute distress.    Appearance: She is well-developed.  Genitourinary:     Pelvic exam was performed with patient supine.     Uterus normal.     No signs of injury in the vagina.     No vaginal discharge, erythema, tenderness or bleeding.     No cervical motion tenderness, discharge, lesion or polyp.     Uterus is mobile.     Uterus is not enlarged or tender.     No uterine mass detected.    Uterus is anteverted.     No right or left adnexal mass present.     Right adnexa not tender or full.     Left adnexa not tender or full.  HENT:     Head: Normocephalic and atraumatic.  Eyes:     General: No scleral icterus. Neck:     Musculoskeletal: Normal range of motion and neck supple.     Thyroid: Thyromegaly present.  Cardiovascular:     Rate and Rhythm: Normal rate and regular rhythm.     Heart sounds: No murmur. No friction rub. No gallop.   Pulmonary:     Effort: Pulmonary effort is normal. No respiratory distress.     Breath sounds: Normal breath sounds. No wheezing or rales.  Chest:     Breasts:        Right: No inverted nipple, mass, nipple discharge, skin change or tenderness.        Left: No inverted nipple, mass, nipple discharge, skin change or tenderness.  Abdominal:     General: Bowel sounds are normal. There is no distension.     Palpations: Abdomen is soft. There is no mass.     Tenderness: There is no abdominal tenderness. There is no guarding or rebound.  Musculoskeletal: Normal range of motion.        General: No tenderness.  Lymphadenopathy:     Cervical: No cervical adenopathy.     Lower Body: No right inguinal adenopathy. No left inguinal adenopathy.  Neurological:     Mental Status: She is alert and oriented to person, place, and time.     Cranial Nerves: No cranial nerve deficit.  Skin:     General: Skin is warm and dry.     Findings: No erythema or rash.  Psychiatric:        Behavior: Behavior normal.  Judgment: Judgment normal.     Female chaperone present for pelvic and breast  portions of the physical exam  Results: AUDIT Questionnaire (screen for alcoholism): 3 PHQ-9: 2   Assessment: 38 y.o. G66P3001 female here for routine annual gynecologic examination.  Plan: Problem List Items Addressed This Visit    None    Visit Diagnoses    Women's annual routine gynecological examination    -  Primary   Screening for depression       Screening for alcoholism          Screening: -- Blood pressure screen normal -- Colonoscopy - not due -- Mammogram - not due -- Weight screening: obese: discussed management options, including lifestyle, dietary, and exercise. -- Depression screening negative (PHQ-9) -- Nutrition: normal -- cholesterol screening: not due for screening -- osteoporosis screening: not due -- tobacco screening: using: discussed quitting using the 5 A's. She is strongly considering. Counseled regarding the various methods of quitting. She will consider Chantix.  Discussed its use and success rate.  She will consider. Discusseed making a plan with her. -- alcohol screening: AUDIT questionnaire indicates low-risk usage. -- family history of breast cancer screening: done. not at high risk. -- no evidence of domestic violence or intimate partner violence. -- STD screening: gonorrhea/chlamydia NAAT not collected per patient request. -- pap smear not collected per ASCCP guidelines -- flu vaccine: declined   Return in about 1 year (around 08/19/2020) for Annual Gynecologic Examination.   Prentice Docker, MD 08/20/2019 4:15 PM

## 2020-01-05 NOTE — Telephone Encounter (Signed)
Nexplanon rcvd/charged 12/17/18

## 2020-09-29 ENCOUNTER — Other Ambulatory Visit: Payer: Self-pay

## 2020-09-29 ENCOUNTER — Ambulatory Visit (INDEPENDENT_AMBULATORY_CARE_PROVIDER_SITE_OTHER): Payer: Commercial Managed Care - PPO | Admitting: Obstetrics and Gynecology

## 2020-09-29 ENCOUNTER — Encounter: Payer: Self-pay | Admitting: Obstetrics and Gynecology

## 2020-09-29 VITALS — BP 126/74 | Ht 67.0 in | Wt 202.0 lb

## 2020-09-29 DIAGNOSIS — Z01419 Encounter for gynecological examination (general) (routine) without abnormal findings: Secondary | ICD-10-CM

## 2020-09-29 DIAGNOSIS — N946 Dysmenorrhea, unspecified: Secondary | ICD-10-CM | POA: Diagnosis not present

## 2020-09-29 DIAGNOSIS — Z1339 Encounter for screening examination for other mental health and behavioral disorders: Secondary | ICD-10-CM

## 2020-09-29 DIAGNOSIS — Z1331 Encounter for screening for depression: Secondary | ICD-10-CM

## 2020-09-29 MED ORDER — IBUPROFEN 800 MG PO TABS: 800.0000 mg | ORAL_TABLET | Freq: Three times a day (TID) | ORAL | 1 refills | Status: DC

## 2020-09-29 NOTE — Progress Notes (Signed)
Gynecology Annual Exam  PCP: Alba Cory, MD  Chief Complaint  Patient presents with  . Annual Exam   History of Present Illness:  Ms. Jacqueline Baldwin is a 39 y.o. [phone] who LMP was No LMP recorded. Patient has had an implant., presents today for her annual examination.  Her menses are irregular with Nexplanon.  She had one last month.  She will feel severe cramping, bloated, with migraines and she has no bleeding. When she does have a period she has severe cramping and she feels very drained and tired.   She states that her hormones are abnormal. She gets bad cramps, gets migraines easily, she gets depressed. This has been going on for 5-6 months.  She is not 100% sure whether she would want more children or not.   Work for her has been rough lately.  She feels stressed, but not so much that she can't do her daily duties.    She is sexually active.  She has no issues with intercourse.   Last Pap: 08/18/2018 - NILM/HPV negative 04/22/2017 Results were: NILM /neg HPV DNA negative Abnormal in past, LEEP procedure in 10/2015, CIN 2,3 with clear margins Hx of STDs: none  There is no FH of breast cancer. There is no FH of ovarian cancer. The patient does do self-breast exams.  Tobacco use: She smokes 1/2 ppd. She sometimes thinks about quitting.  Alcohol use: social drinker Exercise: she walks a lot at work and home.  The patient wears seatbelts: yes.   The patient reports that domestic violence in her life is absent.   Past Medical History:  Diagnosis Date  . Abnormal Pap smear of cervix   . Blood type A-   . BMI 31.0-31.9,adult   . CIN III (cervical intraepithelial neoplasia III)   . Pap smear of cervix with ASCUS, cannot exclude HGSIL 01/05/2015    Past Surgical History:  Procedure Laterality Date  . COLPOSCOPY  01/17/2015   High grade squamous intraepithelial lesion   . LEEP  10/21/2015   Ectocervix, Leep pathology showed high-grade intraepithelial lesion, margins appear  to be free  . nexplanon  11/25/2015    Prior to Admission medications   Medication Sig Start Date End Date Taking? Authorizing Provider  etonogestrel (NEXPLANON) 68 MG IMPL implant 1 each by Subdermal route once.   Yes [provider]   Allergies: No Known Allergies  Obstetric History: [id number]   Social History   Socioeconomic History  . Marital status: Single    Spouse name: Not on file  . Number of children: Not on file  . Years of education: Not on file  . Highest education level: Not on file  Occupational History  . Not on file  Tobacco Use  . Smoking status: Current Every Day Smoker    Packs/day: 0.25    Years: 20.00    Pack years: 5.00    Types: Cigarettes  . Smokeless tobacco: Never Used  Vaping Use  . Vaping Use: Former  Substance and Sexual Activity  . Alcohol use: No    Alcohol/week: 0.0 standard drinks  . Drug use: No  . Sexual activity: Yes    Partners: Male    Birth control/protection: Implant  Other Topics Concern  . Not on file  Social History Narrative  . Not on file   Social Determinants of Health   Financial Resource Strain:   . Difficulty of Paying Living Expenses: Not on file  Food Insecurity:   . Worried About  Running Out of Food in the Last Year: Not on file  . Ran Out of Food in the Last Year: Not on file  Transportation Needs:   . Lack of Transportation (Medical): Not on file  . Lack of Transportation (Non-Medical): Not on file  Physical Activity:   . Days of Exercise per Week: Not on file  . Minutes of Exercise per Session: Not on file  Stress:   . Feeling of Stress : Not on file  Social Connections:   . Frequency of Communication with Friends and Family: Not on file  . Frequency of Social Gatherings with Friends and Family: Not on file  . Attends Religious Services: Not on file  . Active Member of Clubs or Organizations: Not on file  . Attends Banker Meetings: Not on file  . Marital Status: Not on file   Intimate Partner Violence:   . Fear of Current or Ex-Partner: Not on file  . Emotionally Abused: Not on file  . Physically Abused: Not on file  . Sexually Abused: Not on file    Family History  Problem Relation Age of Onset  . Hyperlipidemia Mother   . Diabetes type II Paternal Grandmother   . Diabetes type II Father     Review of Systems  Constitutional: Negative.   HENT: Negative.   Eyes: Negative.   Respiratory: Negative.   Cardiovascular: Negative.   Gastrointestinal: Negative.   Genitourinary: Negative.   Musculoskeletal: Negative.   Skin: Negative.   Neurological: Negative.   Psychiatric/Behavioral: Negative.      Physical Exam BP 126/74   Ht 5\' 7"  (1.702 m)   Wt 202 lb (91.6 kg)   BMI 31.64 kg/m    Physical Exam Constitutional:      General: She is not in acute distress.    Appearance: Normal appearance. She is well-developed.  Genitourinary:     Pelvic exam was performed with patient in the lithotomy position.     Vulva, urethra, bladder and uterus normal.     No inguinal adenopathy present in the right or left side.    No signs of injury in the vagina.     No vaginal discharge, erythema, tenderness or bleeding.     No cervical motion tenderness, discharge, lesion or polyp.     Uterus is mobile.     Uterus is not enlarged or tender.     No uterine mass detected.    Uterus is anteverted.     No right or left adnexal mass present.     Right adnexa not tender or full.     Left adnexa not tender or full.  HENT:     Head: Normocephalic and atraumatic.  Eyes:     General: No scleral icterus.    Conjunctiva/sclera: Conjunctivae normal.  Neck:     Thyroid: Thyromegaly (unchanged) present.  Cardiovascular:     Rate and Rhythm: Normal rate and regular rhythm.     Heart sounds: No murmur heard.  No friction rub. No gallop.   Pulmonary:     Effort: Pulmonary effort is normal. No respiratory distress.     Breath sounds: Normal breath sounds. No wheezing  or rales.  Chest:     Breasts:        Right: No inverted nipple, mass, nipple discharge, skin change or tenderness.        Left: No inverted nipple, mass, nipple discharge, skin change or tenderness.  Abdominal:     General: Bowel sounds  are normal. There is no distension.     Palpations: Abdomen is soft. There is no mass.     Tenderness: There is no abdominal tenderness. There is no guarding or rebound.  Musculoskeletal:        General: No swelling or tenderness. Normal range of motion.     Cervical back: Normal range of motion and neck supple.  Lymphadenopathy:     Cervical: No cervical adenopathy.     Lower Body: No right inguinal adenopathy. No left inguinal adenopathy.  Neurological:     General: No focal deficit present.     Mental Status: She is alert and oriented to person, place, and time.     Cranial Nerves: No cranial nerve deficit.  Skin:    General: Skin is warm and dry.     Findings: No erythema or rash.  Psychiatric:        Mood and Affect: Mood normal.        Behavior: Behavior normal.        Judgment: Judgment normal.     Female chaperone present for pelvic and breast  portions of the physical exam  Results: AUDIT Questionnaire (screen for alcoholism): 3 PHQ-9: 2   Assessment: 39 y.o. G3P3003 female here for routine annual gynecologic examination  Plan: Problem List Items Addressed This Visit    None    Visit Diagnoses    Women's annual routine gynecological examination    -  Primary   Screening for depression       Screening for alcoholism       Dysmenorrhea       Relevant Medications   ibuprofen (ADVIL) 800 MG tablet      Screening: -- Blood pressure screen normal -- Weight screening: overweight: continue to monitor -- Depression screening negative (PHQ-9) -- Nutrition: normal -- cholesterol screening: not due for screening -- osteoporosis screening: not due -- tobacco screening: using: discussed quitting using the 5 A's -- alcohol  screening: AUDIT questionnaire indicates low-risk usage. -- family history of breast cancer screening: done. not at high risk. -- no evidence of domestic violence or intimate partner violence. -- STD screening: gonorrhea/chlamydia NAAT not collected per patient request. -- pap smear not collected per ASCCP guidelines -- flu vaccine will get at work -- COVID 19 vaccine: declines so far.   Thomasene Mohair, MD 09/29/2020 2:38 PM

## 2021-12-19 ENCOUNTER — Other Ambulatory Visit (HOSPITAL_COMMUNITY)
Admission: RE | Admit: 2021-12-19 | Discharge: 2021-12-19 | Disposition: A | Discharge status: Home / Self Care | Payer: Commercial Managed Care - PPO | Source: Ambulatory Visit | Attending: Obstetrics and Gynecology | Admitting: Obstetrics and Gynecology

## 2021-12-19 ENCOUNTER — Other Ambulatory Visit: Payer: Self-pay

## 2021-12-19 ENCOUNTER — Ambulatory Visit (INDEPENDENT_AMBULATORY_CARE_PROVIDER_SITE_OTHER): Payer: Commercial Managed Care - PPO | Admitting: Obstetrics and Gynecology

## 2021-12-19 ENCOUNTER — Encounter: Payer: Self-pay | Admitting: Obstetrics and Gynecology

## 2021-12-19 VITALS — BP 100/64 | Ht 67.0 in | Wt 200.0 lb

## 2021-12-19 DIAGNOSIS — Z3202 Encounter for pregnancy test, result negative: Secondary | ICD-10-CM

## 2021-12-19 DIAGNOSIS — Z30430 Encounter for insertion of intrauterine contraceptive device: Secondary | ICD-10-CM

## 2021-12-19 DIAGNOSIS — E049 Nontoxic goiter, unspecified: Secondary | ICD-10-CM

## 2021-12-19 DIAGNOSIS — Z124 Encounter for screening for malignant neoplasm of cervix: Secondary | ICD-10-CM | POA: Insufficient documentation

## 2021-12-19 DIAGNOSIS — Z01419 Encounter for gynecological examination (general) (routine) without abnormal findings: Secondary | ICD-10-CM

## 2021-12-19 DIAGNOSIS — Z3046 Encounter for surveillance of implantable subdermal contraceptive: Secondary | ICD-10-CM

## 2021-12-19 DIAGNOSIS — D069 Carcinoma in situ of cervix, unspecified: Secondary | ICD-10-CM | POA: Diagnosis present

## 2021-12-19 DIAGNOSIS — Z1151 Encounter for screening for human papillomavirus (HPV): Secondary | ICD-10-CM | POA: Diagnosis present

## 2021-12-19 LAB — POCT URINE PREGNANCY: Preg Test, Ur: NEGATIVE

## 2021-12-19 MED ORDER — LEVONORGESTREL 20 MCG/DAY IU IUD: 1.0000 | INTRAUTERINE_SYSTEM | Freq: Once | INTRAUTERINE | 0 refills | Status: AC

## 2021-12-19 NOTE — Progress Notes (Signed)
PCP:  Alba Cory, MD   Chief Complaint  Patient presents with   Gynecologic Exam   Contraception    Nexplanon removal, not sure of new BC     HPI:      Ms. Jacqueline Baldwin is a 41 y.o. G3P3003 whose LMP was Patient's last menstrual period was 12/08/2021 (exact date)., presents today for her annual examination.  Her menses are irregular with nexplanon, lasting 4-7 days, mod flow, mild dysmen improved with NSAIDs. Pt feels like dysmen is worse with nexplanon and has occas non-menstrual pelvic pain. Also with mood changes.   Sex activity: single partner, contraception - Nexplanon placed 12/17/18. No pain/bleeding.  Pt with hx of migraines with aura and tob use >35 yo. Has done OCPs and depo in past.  Last Pap: 08/18/18  Results were: no abnormalities /neg HPV DNA  Hx of STDs: HPV; LEEP procedure in 10/2015, CIN 2,3 with clear margins  There is no FH of breast cancer. There is no FH of ovarian cancer. The patient does do self-breast exams.  Tobacco use: 1/2 ppd, not ready to quit Alcohol use: none No drug use.  Exercise: moderately active  She does get adequate calcium but not Vitamin D in her diet.  Hx of enlarged thyroid, neg labs 2018. Had thyroid u/s with non-specified enlargement and benign bilat nodules. No designated f/u recommended.   Patient Active Problem List   Diagnosis Date Noted   Enlarged thyroid gland 05/07/2017   CIN III (cervical intraepithelial neoplasia grade III) with severe dysplasia 10/21/2015   Smoker 08/25/2015    Past Surgical History:  Procedure Laterality Date   COLPOSCOPY  01/17/2015   High grade squamous intraepithelial lesion    LEEP  10/21/2015   Ectocervix, Leep pathology showed high-grade intraepithelial lesion, margins appear to be free   nexplanon  11/25/2015    Family History  Problem Relation Age of Onset   Hyperlipidemia Mother    Diabetes type II Paternal Grandmother    Diabetes type II Father     Social History    Socioeconomic History   Marital status: Single    Spouse name: Not on file   Number of children: Not on file   Years of education: Not on file   Highest education level: Not on file  Occupational History   Not on file  Tobacco Use   Smoking status: Every Day    Packs/day: 0.25    Years: 20.00    Pack years: 5.00    Types: Cigarettes   Smokeless tobacco: Never  Vaping Use   Vaping Use: Former  Substance and Sexual Activity   Alcohol use: No    Alcohol/week: 0.0 standard drinks   Drug use: No   Sexual activity: Yes    Partners: Male    Birth control/protection: Implant  Other Topics Concern   Not on file  Social History Narrative   Not on file   Social Determinants of Health   Financial Resource Strain: Not on file  Food Insecurity: Not on file  Transportation Needs: Not on file  Physical Activity: Not on file  Stress: Not on file  Social Connections: Not on file  Intimate Partner Violence: Not on file     Current Outpatient Medications:    levonorgestrel (MIRENA) 20 MCG/DAY IUD, 1 each by Intrauterine route once for 1 dose., Disp: 1 each, Rfl: 0     ROS:  Review of Systems  Constitutional:  Negative for fatigue, fever and unexpected weight change.  Respiratory:  Negative for cough, shortness of breath and wheezing.   Cardiovascular:  Negative for chest pain, palpitations and leg swelling.  Gastrointestinal:  Negative for blood in stool, constipation, diarrhea, nausea and vomiting.  Endocrine: Negative for cold intolerance, heat intolerance and polyuria.  Genitourinary:  Negative for dyspareunia, dysuria, flank pain, frequency, genital sores, hematuria, menstrual problem, pelvic pain, urgency, vaginal bleeding, vaginal discharge and vaginal pain.  Musculoskeletal:  Negative for back pain, joint swelling and myalgias.  Skin:  Negative for rash.  Neurological:  Negative for dizziness, syncope, light-headedness, numbness and headaches.  Hematological:   Negative for adenopathy.  Psychiatric/Behavioral:  Negative for agitation, confusion, sleep disturbance and suicidal ideas. The patient is not nervous/anxious.   BREAST: No symptoms   Objective: BP 100/64    Ht 5\' 7"  (1.702 m)    Wt 200 lb (90.7 kg)    LMP 12/08/2021 (Exact Date)    BMI 31.32 kg/m    Physical Exam Constitutional:      Appearance: She is well-developed.  Genitourinary:     Vulva normal.     Right Labia: No rash, tenderness or lesions.    Left Labia: No tenderness, lesions or rash.    No vaginal discharge, erythema or tenderness.      Right Adnexa: not tender and no mass present.    Left Adnexa: not tender and no mass present.    No cervical friability or polyp.     Uterus is not enlarged or tender.  Breasts:    Right: No mass, nipple discharge, skin change or tenderness.     Left: No mass, nipple discharge, skin change or tenderness.  Neck:     Thyroid: Thyromegaly present.  Cardiovascular:     Rate and Rhythm: Normal rate and regular rhythm.     Heart sounds: Normal heart sounds. No murmur heard. Pulmonary:     Effort: Pulmonary effort is normal.     Breath sounds: Normal breath sounds.  Abdominal:     Palpations: Abdomen is soft.     Tenderness: There is no abdominal tenderness. There is no guarding or rebound.  Musculoskeletal:        General: Normal range of motion.     Cervical back: Normal range of motion.  Lymphadenopathy:     Cervical: No cervical adenopathy.  Neurological:     General: No focal deficit present.     Mental Status: She is alert and oriented to person, place, and time.     Cranial Nerves: No cranial nerve deficit.  Skin:    General: Skin is warm and dry.  Psychiatric:        Mood and Affect: Mood normal.        Behavior: Behavior normal.        Thought Content: Thought content normal.        Judgment: Judgment normal.  Vitals reviewed.    Results: Results for orders placed or performed in visit on 12/19/21 (from the past  24 hour(s))  POCT urine pregnancy     Status: Normal   Collection Time: 12/19/21  2:52 PM  Result Value Ref Range   Preg Test, Ur Negative Negative     Nexplanon removal Procedure note - The Nexplanon was noted in the patient's arm and the end was identified. The skin was cleansed with a Betadine solution. A small injection of subcutaneous lidocaine with epinephrine was given over the end of the implant. An incision was made at the end of the implant.  The rod was noted in the incision and grasped with a hemostat. It was noted to be intact.  Steri-Strip was placed approximating the incision. Hemostasis was noted.   IUD Insertion Procedure Note Patient identified, informed consent performed, consent signed.   Discussed risks of irregular bleeding, cramping, infection, malpositioning or misplacement of the IUD outside the uterus which may require further procedure such as laparoscopy, risk of failure <1%. Time out was performed.  Urine pregnancy test negative.  Speculum placed in the vagina.  Cervix visualized.  Cleaned with Betadine x 2.  Grasped anteriorly with a single tooth tenaculum.  Uterus sounded to 8.0 cm.   IUD placed per manufacturer's recommendations.  Strings trimmed to 3 cm. Tenaculum was removed, good hemostasis noted.  Patient tolerated procedure well.   Assessment/Plan: Encounter for annual routine gynecological examination  Cervical cancer screening - Plan: Cytology - PAP  Screening for HPV (human papillomavirus) - Plan: Cytology - PAP  High grade squamous intraepithelial lesion (HGSIL), grade 3 CIN, on biopsy of cervix - Plan: Cytology - PAP; repeat pap today. Will f/u with results.   Enlarged thyroid gland - Plan: TSH + free T4; check labs. Will f/u if abn.   Nexplanon removal--She was told to remove the dressing in 12-24 hours, to keep the incision area dry for 24 hours and to remove the Steristrip in 2-3  days.  Notify us if any signs of tenderness, redness, pain, or  fevers develop.  Encounter for IUD insertion--She was advised to have backup contraception for one week.   Call if you are having increasing pain, cramps or bleeding or if you have a fever greater than 100.4 degrees F., shaking chills, nausea or vomiting. Patient was also asked to check IUD strings periodically and follow up in 4 weeks for IUD check.  Meds ordered this encounter  Medications   levonorgestrel (MIRENA) 20 MCG/DAY IUD    Sig: 1 each by Intrauterine route once for 1 dose.    Dispense:  1 each    Refill:  0    Order Specific Question:   Supervising Provider    Answer:   Gae Dry [id number]             GYN counsel adequate intake of calcium and vitamin D, diet and exercise, prog only options     F/U  Return in about 4 weeks (around 01/16/2022) for IUD f/u.  Rocko Fesperman B. Rondy Krupinski, PA-C 12/19/2021 2:53 PM

## 2021-12-19 NOTE — Patient Instructions (Signed)
I value your feedback and you entrusting us with your care. If you get a [facility] patient survey, I would appreciate you taking the time to let us know about your experience today. Thank you!  Remove the dressing in 24 hours,  keep the incision area dry for 24 hours and remove the Steristrip in 2-3  days.  Notify us if any signs of tenderness, redness, pain, or fevers develop.   

## 2021-12-20 LAB — TSH+FREE T4
Free T4: 1.45 ng/dL (ref 0.82–1.77)
TSH: 0.989 u[IU]/mL (ref 0.450–4.500)

## 2021-12-21 LAB — CYTOLOGY - PAP
Comment: NEGATIVE
Diagnosis: UNDETERMINED — AB
High risk HPV: NEGATIVE

## 2022-01-16 ENCOUNTER — Encounter: Payer: Self-pay | Admitting: Obstetrics and Gynecology

## 2022-01-16 ENCOUNTER — Ambulatory Visit: Payer: Commercial Managed Care - PPO | Admitting: Obstetrics and Gynecology

## 2022-01-16 ENCOUNTER — Other Ambulatory Visit: Payer: Self-pay

## 2022-01-16 VITALS — BP 112/70 | Ht 67.0 in | Wt 201.0 lb

## 2022-01-16 DIAGNOSIS — Z30431 Encounter for routine checking of intrauterine contraceptive device: Secondary | ICD-10-CM

## 2022-01-16 NOTE — Progress Notes (Signed)
° °  Chief Complaint  Patient presents with   IUD check    No concerns     History of Present Illness:  Jacqueline Baldwin is a 41 y.o. that had a Mirena IUD placed approximately 1 month ago. Since that time, she denies dyspareunia, pelvic pain, vaginal d/c, heavy bleeding. Had spotting and cramping first wk after placement but nothing since. Moods improved off nexplanon, too. Had to take Rx ibup for dysmen with nexplanon, may need Rx RF if has dysmen with IUD. Ok so far.   Review of Systems  Constitutional:  Negative for fever.  Gastrointestinal:  Negative for blood in stool, constipation, diarrhea, nausea and vomiting.  Genitourinary:  Negative for dyspareunia, dysuria, flank pain, frequency, hematuria, urgency, vaginal bleeding, vaginal discharge and vaginal pain.  Musculoskeletal:  Negative for back pain.  Skin:  Negative for rash.   Physical Exam:  BP 112/70    Ht 5\' 7"  (1.702 m)    Wt 201 lb (91.2 kg)    BMI 31.48 kg/m  Body mass index is 31.48 kg/m.  Pelvic exam:  Two IUD strings present seen coming from the cervical os. EGBUS, vaginal vault and cervix: within normal limits   Assessment:   Encounter for routine checking of intrauterine contraceptive device (IUD)  IUD strings present in proper location; pt doing well  Plan: F/u if any signs of infection or can no longer feel the strings.   Morene Cecilio B. Vadie Principato, PA-C 01/16/2022 2:10 PM

## 2024-04-26 ENCOUNTER — Emergency Department

## 2024-04-26 ENCOUNTER — Encounter: Payer: Self-pay | Admitting: Emergency Medicine

## 2024-04-26 ENCOUNTER — Other Ambulatory Visit: Payer: Self-pay

## 2024-04-26 ENCOUNTER — Emergency Department
Admission: EM | Admit: 2024-04-26 | Discharge: 2024-04-26 | Disposition: A | Discharge status: Home / Self Care | Attending: Emergency Medicine | Admitting: Emergency Medicine

## 2024-04-26 DIAGNOSIS — I899 Noninfective disorder of lymphatic vessels and lymph nodes, unspecified: Secondary | ICD-10-CM

## 2024-04-26 DIAGNOSIS — R509 Fever, unspecified: Secondary | ICD-10-CM | POA: Diagnosis not present

## 2024-04-26 DIAGNOSIS — M545 Low back pain, unspecified: Secondary | ICD-10-CM | POA: Insufficient documentation

## 2024-04-26 DIAGNOSIS — J020 Streptococcal pharyngitis: Secondary | ICD-10-CM | POA: Diagnosis not present

## 2024-04-26 DIAGNOSIS — A419 Sepsis, unspecified organism: Secondary | ICD-10-CM | POA: Insufficient documentation

## 2024-04-26 DIAGNOSIS — M25511 Pain in right shoulder: Secondary | ICD-10-CM | POA: Diagnosis not present

## 2024-04-26 DIAGNOSIS — M25512 Pain in left shoulder: Secondary | ICD-10-CM | POA: Diagnosis not present

## 2024-04-26 DIAGNOSIS — J029 Acute pharyngitis, unspecified: Secondary | ICD-10-CM | POA: Diagnosis present

## 2024-04-26 LAB — URINALYSIS, ROUTINE W REFLEX MICROSCOPIC
Bacteria, UA: NONE SEEN
Bilirubin Urine: NEGATIVE
Bilirubin Urine: NEGATIVE
Glucose, UA: NEGATIVE mg/dL
Glucose, UA: NEGATIVE mg/dL
Ketones, ur: NEGATIVE mg/dL
Ketones, ur: NEGATIVE mg/dL
Leukocytes,Ua: NEGATIVE
Nitrite: NEGATIVE
Nitrite: NEGATIVE
Protein, ur: 30 mg/dL — AB
Protein, ur: NEGATIVE mg/dL
RBC / HPF: >50 RBC/hpf (ref 0–5)
Specific Gravity, Urine: 1.024 (ref 1.005–1.030)
Specific Gravity, Urine: 1.032 — ABNORMAL HIGH (ref 1.005–1.030)
Squamous Epithelial / HPF: >50 /HPF (ref 0–5)
WBC, UA: 0 WBC/hpf (ref 0–5)
pH: 7 (ref 5.0–8.0)
pH: 6 (ref 5.0–8.0)

## 2024-04-26 LAB — CBC WITH DIFFERENTIAL/PLATELET
Abs Immature Granulocytes: 0.15 10*3/uL — ABNORMAL HIGH (ref 0.00–0.07)
Basophils Absolute: 0.1 10*3/uL (ref 0.0–0.1)
Basophils Relative: 0 %
Eosinophils Absolute: 0.1 10*3/uL (ref 0.0–0.5)
Eosinophils Relative: 1 %
HCT: 42.7 % (ref 36.0–46.0)
Hemoglobin: 14.9 g/dL (ref 12.0–15.0)
Immature Granulocytes: 1 %
Lymphocytes Relative: 6 %
Lymphs Abs: 0.9 10*3/uL (ref 0.7–4.0)
MCH: 31.1 pg (ref 26.0–34.0)
MCHC: 34.9 g/dL (ref 30.0–36.0)
MCV: 89.1 fL (ref 80.0–100.0)
Monocytes Absolute: 0.9 10*3/uL (ref 0.1–1.0)
Monocytes Relative: 6 %
Neutro Abs: 13 10*3/uL — ABNORMAL HIGH (ref 1.7–7.7)
Neutrophils Relative %: 86 %
Platelets: 188 10*3/uL (ref 150–400)
RBC: 4.79 MIL/uL (ref 3.87–5.11)
RDW: 12 % (ref 11.5–15.5)
WBC: 15.1 10*3/uL — ABNORMAL HIGH (ref 4.0–10.5)
nRBC: 0 % (ref 0.0–0.2)

## 2024-04-26 LAB — LACTIC ACID, PLASMA: Lactic Acid, Venous: 1 mmol/L (ref 0.5–1.9)

## 2024-04-26 LAB — RESP PANEL BY RT-PCR (RSV, FLU A&B, COVID)  RVPGX2
Influenza A by PCR: NEGATIVE
Influenza B by PCR: NEGATIVE
Resp Syncytial Virus by PCR: NEGATIVE
SARS Coronavirus 2 by RT PCR: NEGATIVE

## 2024-04-26 LAB — PROCALCITONIN: Procalcitonin: 0.17 ng/mL

## 2024-04-26 LAB — BASIC METABOLIC PANEL WITH GFR
Anion gap: 10 (ref 5–15)
BUN: 13 mg/dL (ref 6–20)
CO2: 22 mmol/L (ref 22–32)
Calcium: 9.3 mg/dL (ref 8.9–10.3)
Chloride: 104 mmol/L (ref 98–111)
Creatinine, Ser: 0.88 mg/dL (ref 0.44–1.00)
GFR, Estimated: >60 mL/min (ref >60)
Glucose, Bld: 110 mg/dL — ABNORMAL HIGH (ref 70–99)
Potassium: 3.9 mmol/L (ref 3.5–5.1)
Sodium: 136 mmol/L (ref 135–145)

## 2024-04-26 LAB — HEPATIC FUNCTION PANEL
ALT: 61 U/L — ABNORMAL HIGH (ref 0–44)
AST: 32 U/L (ref 15–41)
Albumin: 4 g/dL (ref 3.5–5.0)
Alkaline Phosphatase: 46 U/L (ref 38–126)
Bilirubin, Direct: 0.2 mg/dL (ref 0.0–0.2)
Indirect Bilirubin: 0.5 mg/dL (ref 0.3–0.9)
Total Bilirubin: 0.7 mg/dL (ref 0.0–1.2)
Total Protein: 7.7 g/dL (ref 6.5–8.1)

## 2024-04-26 LAB — GROUP A STREP BY PCR: Group A Strep by PCR: DETECTED — AB

## 2024-04-26 LAB — POC URINE PREG, ED: Preg Test, Ur: NEGATIVE

## 2024-04-26 LAB — LIPASE, BLOOD: Lipase: 30 U/L (ref 11–51)

## 2024-04-26 MED ORDER — ACETAMINOPHEN 325 MG PO TABS
650.0000 mg | ORAL_TABLET | Freq: Once | ORAL | Status: AC | PRN
  Administered 2024-04-26: 650 mg via ORAL
  Filled 2024-04-26: qty 2

## 2024-04-26 MED ORDER — SODIUM CHLORIDE 0.9 % IV BOLUS
1000.0000 mL | Freq: Once | INTRAVENOUS | Status: AC
  Administered 2024-04-26: 1000 mL via INTRAVENOUS

## 2024-04-26 MED ORDER — KETOROLAC TROMETHAMINE 15 MG/ML IJ SOLN
15.0000 mg | Freq: Once | INTRAMUSCULAR | Status: AC
  Administered 2024-04-26: 15 mg via INTRAVENOUS
  Filled 2024-04-26: qty 1

## 2024-04-26 MED ORDER — AMOXICILLIN-POT CLAVULANATE 875-125 MG PO TABS: 1.0000 | ORAL_TABLET | Freq: Two times a day (BID) | ORAL | 0 refills | Status: AC

## 2024-04-26 MED ORDER — LIDOCAINE 5 % EX PTCH: 1.0000 | MEDICATED_PATCH | CUTANEOUS | 0 refills | Status: AC

## 2024-04-26 MED ORDER — SODIUM CHLORIDE 0.9 % IV SOLN
1.0000 g | Freq: Once | INTRAVENOUS | Status: AC
  Administered 2024-04-26: 1 g via INTRAVENOUS
  Filled 2024-04-26: qty 10

## 2024-04-26 MED ORDER — IOHEXOL 300 MG/ML  SOLN
100.0000 mL | Freq: Once | INTRAMUSCULAR | Status: AC | PRN
  Administered 2024-04-26: 100 mL via INTRAVENOUS

## 2024-04-26 NOTE — ED Provider Notes (Signed)
 [facility] Provider Note    Event Date/Time   First MD Initiated Contact with Patient 04/26/24 1951     (approximate)   History   multiple complaints   HPI  Jacqueline Baldwin is a 43 y.o. female with IUD who comes in with concern for multiple symptoms patient reports that she started feeling ill about 5 days ago.  She reports having a lymph node palpated in her neck and a little bit of a sore throat but then she started developing some shoulder pain, headaches, low back pain some lymph nodes in her groin.  She reports developing a fever today.  She denies any tick bites, rashes, swollen joints, IV drug use.  She has been vaccinated growing up.  She denies having her spleen removed.  She denies any cough, shortness of breath, leg swelling.  She denies any new sexual partners or vaginal symptoms.  She states that she felt like she had to go to the bathroom but she could not get to the bathroom in time and peed on herself but denies any other urinary symptoms.  Denies any weakness, numbness in her legs, saddle anesthesia, stool incontinence.  Physical Exam   Triage Vital Signs: ED Triage Vitals  Encounter Vitals Group     BP 04/26/24 1938 128/72     Systolic BP Percentile --      Diastolic BP Percentile --      Pulse Rate 04/26/24 1938 (!) 115     Resp 04/26/24 1938 18     Temp 04/26/24 1938 (!) 100.7 F (38.2 C)     Temp Source 04/26/24 1938 Oral     SpO2 04/26/24 1938 95 %     Weight 04/26/24 1934 210 lb (95.3 kg)     Height 04/26/24 1934 5\' 7"  (1.702 m)     Head Circumference --      Peak Flow --      Pain Score 04/26/24 1934 8     Pain Loc --      Pain Education --      Exclude from Growth Chart --     Most recent vital signs: Vitals:   04/26/24 1938  BP: 128/72  Pulse: (!) 115  Resp: 18  Temp: (!) 100.7 F (38.2 C)  SpO2: 95%     General: Awake, no distress.  CV:  Good peripheral perfusion.  Resp:  Normal effort.  Clear  lungs Abd:  No distention.  Slight tenderness in the lower abdomen Other:  No rashes noted. Some mild low back pain no rash noted.  No L-spine tenderness with palpation equal strength of legs.  Sensation intact. Patient has supple neck.  Cranial nerves are intact  ED Results / Procedures / Treatments   Labs (all labs ordered are listed, but only abnormal results are displayed) Labs Reviewed  GROUP A STREP BY PCR - Abnormal; Notable for the following components:      Result Value   Group A Strep by PCR DETECTED (*)    All other components within normal limits  CBC WITH DIFFERENTIAL/PLATELET - Abnormal; Notable for the following components:   WBC 15.1 (*)    Neutro Abs 13.0 (*)    Abs Immature Granulocytes 0.15 (*)    All other components within normal limits  BASIC METABOLIC PANEL WITH GFR - Abnormal; Notable for the following components:   Glucose, Bld 110 (*)    All other components within normal limits  URINALYSIS, ROUTINE W REFLEX MICROSCOPIC -  Abnormal; Notable for the following components:   Color, Urine AMBER (*)    APPearance CLOUDY (*)    Hgb urine dipstick MODERATE (*)    Protein, ur 30 (*)    Leukocytes,Ua TRACE (*)    Bacteria, UA FEW (*)    All other components within normal limits  URINALYSIS, ROUTINE W REFLEX MICROSCOPIC - Abnormal; Notable for the following components:   Color, Urine YELLOW (*)    APPearance CLEAR (*)    Specific Gravity, Urine 1.032 (*)    Hgb urine dipstick MODERATE (*)    All other components within normal limits  HEPATIC FUNCTION PANEL - Abnormal; Notable for the following components:   ALT 61 (*)    All other components within normal limits  POC URINE PREG, ED - Normal  RESP PANEL BY RT-PCR (RSV, FLU A&B, COVID)  RVPGX2  CULTURE, BLOOD (ROUTINE X 2)  CULTURE, BLOOD (ROUTINE X 2)  LACTIC ACID, PLASMA  PROCALCITONIN  LIPASE, BLOOD  LACTIC ACID, PLASMA  MONONUCLEOSIS SCREEN     EKG  My interpretation of EKG:  EKG is sinus  tachycardic rate of 100 without any ST elevation or T wave inversions, normal intervals  RADIOLOGY I have reviewed the xray personally and interpreted no pneumonia   PROCEDURES:  Critical Care performed: Yes, see critical care procedure note(s)  .1-3 Lead EKG Interpretation  Performed by: Lubertha Rush, MD Authorized by: Lubertha Rush, MD     Interpretation: abnormal     ECG rate:  110   ECG rate assessment: tachycardic     Rhythm: sinus tachycardia     Ectopy: none     Conduction: normal   .Critical Care  Performed by: Lubertha Rush, MD Authorized by: Lubertha Rush, MD   Critical care provider statement:    Critical care time (minutes):  30   Critical care was necessary to treat or prevent imminent or life-threatening deterioration of the following conditions:  Sepsis   Critical care was time spent personally by me on the following activities:  Development of treatment plan with patient or surrogate, discussions with consultants, evaluation of patient's response to treatment, examination of patient, ordering and review of laboratory studies, ordering and review of radiographic studies, ordering and performing treatments and interventions, pulse oximetry, re-evaluation of patient's condition and review of old charts    MEDICATIONS ORDERED IN ED: Medications  acetaminophen  (TYLENOL ) tablet 650 mg (650 mg Oral Given 04/26/24 1943)  sodium chloride  0.9 % bolus 1,000 mL (0 mLs Intravenous Stopped 04/26/24 2144)  iohexol (OMNIPAQUE) 300 MG/ML solution 100 mL (100 mLs Intravenous Contrast Given 04/26/24 2045)  ketorolac  (TORADOL ) 15 MG/ML injection 15 mg (15 mg Intravenous Given 04/26/24 2209)  sodium chloride  0.9 % bolus 1,000 mL (0 mLs Intravenous Stopped 04/26/24 2317)  cefTRIAXone (ROCEPHIN) 1 g in sodium chloride  0.9 % 100 mL IVPB (0 g Intravenous Stopped 04/26/24 2237)     IMPRESSION / MDM / ASSESSMENT AND PLAN / ED COURSE  I reviewed the triage vital signs and the nursing  notes.   Patient's presentation is most consistent with acute presentation with potential threat to life or bodily function.   Patient comes in slightly tachycardic which suspect is related to the fever with concerns for bodyaches,  lymph nodes, back pain, lower abdominal pain, headaches.  Blood workup was done to evaluate for infectious source.  CT imaging was done of the abdomen given some lower abdominal pain urine evaluate for UTI.  She denies  any tick bites, travel, immunosuppression.  She denies any chest pain, shortness of breath to suggest myocarditis, PE, ACS.  She denies any shortness of breath or cough to suggest pneumonia.  Patient's white count is elevated at 15.  Her initial urine was not a good sample had a lot of squamous cells so we repeated it and there was a little bit of RBCs noted in it but no evidence of UTI.  COVID and flu were negative.  Lactate was normal.  Strep test was positive.  Procalcitonin was only slightly elevated pricey test was negative  Chest x-ray was negative IMPRESSION: 1. No acute abnormality in the abdomen or pelvis. 2. Left inguinal and external iliac lymphadenopathy. If the patient has a history of malignancy ultrasound-guided biopsy should be considered to exclude nodal metastases. Otherwise correlation with clinical and laboratory evidence of lymphoproliferative disorder and three-month follow-up CT is recommended. 3. Mild hepatic steatosis.  Discussed with patient CT imaging she denies any known history of cancer.  She will follow-up with oncology for recommendations for biopsy.  However this could be reactive given the fever.  Reevaluated patient back exam.  She reports some mild low back pain but she is got no significant tenderness with palpation she is neuro intact.  We discussed MRI but she is at no risk factors such as IV drug use, recent injections in the spine and given her other symptoms it seems less likely to be primarily a back issue.   At this time patient wanted to hold off on the MRI.  We discussed admission to the hospital given patient meets sepsis criteria for blood culture rule out and monitoring overnight.  Patient declined stating that she preferred to go home.  She understands that if blood cultures are positive that we would ask her to come back to the emergency room for evaluation.  She expressed understanding but would prefer to go home.  Given her vitals have normalized and there are no signs of severe sepsis with normal lactate and normal vitals, I think it is reasonable to trial outpatient antibiotics.   She does occasionally have some slightly lower oxygen levels but is usually when she is asleep and I did recommend that she follow-up for sleep study.  When she is awake her oxygen levels are 95 to 96% and she denies any shortness of breath or coughing to suggest pneumonia.  Is interesting that her strep test is positive because she really does not have any sore throat at this time although she did have a little sore neck a few days ago with lymph node feeling in her throat. and I did evaluate her oropharynx she is got no evidence of peritonsillar abscess. No lymph node palpated now.  She has no signs of menigititis.  Neck is supple. No confusion. No rash.   At this time we will treat for possible strep throat with antibiotics as she has declined admission to the hospital but she will return if symptoms are worsening she can follow-up with oncology for evaluation of her lymph nodes.   The patient is on the cardiac monitor to evaluate for evidence of arrhythmia and/or significant heart rate changes.      FINAL CLINICAL IMPRESSION(S) / ED DIAGNOSES   Final diagnoses:  Strep pharyngitis  Sepsis, due to unspecified organism, unspecified whether acute organ dysfunction present [facility])     Rx / DC Orders   ED Discharge Orders          Ordered  amoxicillin-clavulanate (AUGMENTIN) 875-125 MG tablet  2 times daily         04/26/24 2338    lidocaine  (LIDODERM ) 5 %  Every 24 hours        04/26/24 2338             Note:  This document was prepared using Dragon voice recognition software and may include unintentional dictation errors.   Lubertha Rush, MD 04/26/24 1610    Lubertha Rush, MD 04/27/24 [phone]

## 2024-04-26 NOTE — ED Triage Notes (Signed)
 Pt via POV c/o swollen lymph nodes in left groin, lower back pain, bilateral shoulder pain, headache, and new urinary incontinence. Has had an IUD in place since Jan 2023 with no periods since then but noticed spotting yesterday which was unexpected. Unsure if she could be pregnant.

## 2024-04-26 NOTE — Discharge Instructions (Addendum)
 We discussed admission to the hospital given you meet sepsis criteria with your elevated fever and white count and to make sure your blood cultures do not grow any infectious process.  However at this time you prefer to go home and return to the ER if you develop worsening symptoms or any other concerns  Take Tylenol  1 g every 8 hours and ibuprofen  600 every 6-8 hours with food to help with fevers, pain, body aches.  Take the antibiotics starting tomorrow  Return to the ER if you develop worsening symptoms or any other concerns  IMPRESSION:  1. No acute abnormality in the abdomen or pelvis.  2. Left inguinal and external iliac lymphadenopathy. If the patient  has a history of malignancy ultrasound-guided biopsy should be  considered to exclude nodal metastases. Otherwise correlation with  clinical and laboratory evidence of lymphoproliferative disorder and  three-month follow-up CT is recommended.  3. Mild hepatic steatosis.

## 2024-04-27 LAB — MONONUCLEOSIS SCREEN: Mono Screen: NEGATIVE

## 2024-04-30 LAB — CULTURE, BLOOD (ROUTINE X 2)
Culture: NO GROWTH
Culture: NO GROWTH
Special Requests: ADEQUATE

## 2024-05-05 ENCOUNTER — Inpatient Hospital Stay: Attending: Oncology | Admitting: Oncology

## 2024-05-05 ENCOUNTER — Inpatient Hospital Stay

## 2024-05-05 ENCOUNTER — Encounter: Payer: Self-pay | Admitting: Oncology

## 2024-05-05 VITALS — BP 115/85 | HR 81 | Temp 97.5°F | Resp 18 | Ht 67.0 in | Wt 206.0 lb

## 2024-05-05 DIAGNOSIS — R590 Localized enlarged lymph nodes: Secondary | ICD-10-CM | POA: Diagnosis not present

## 2024-05-05 DIAGNOSIS — K760 Fatty (change of) liver, not elsewhere classified: Secondary | ICD-10-CM | POA: Diagnosis not present

## 2024-05-05 DIAGNOSIS — F17210 Nicotine dependence, cigarettes, uncomplicated: Secondary | ICD-10-CM | POA: Diagnosis not present

## 2024-05-05 DIAGNOSIS — Z793 Long term (current) use of hormonal contraceptives: Secondary | ICD-10-CM | POA: Insufficient documentation

## 2024-05-05 DIAGNOSIS — Z79899 Other long term (current) drug therapy: Secondary | ICD-10-CM | POA: Diagnosis not present

## 2024-05-05 DIAGNOSIS — Z833 Family history of diabetes mellitus: Secondary | ICD-10-CM | POA: Diagnosis not present

## 2024-05-05 DIAGNOSIS — R591 Generalized enlarged lymph nodes: Secondary | ICD-10-CM

## 2024-05-05 DIAGNOSIS — Z83438 Family history of other disorder of lipoprotein metabolism and other lipidemia: Secondary | ICD-10-CM | POA: Insufficient documentation

## 2024-05-05 LAB — CBC WITH DIFFERENTIAL/PLATELET
Abs Immature Granulocytes: 0.05 10*3/uL (ref 0.00–0.07)
Basophils Absolute: 0 10*3/uL (ref 0.0–0.1)
Basophils Relative: 1 %
Eosinophils Absolute: 0.3 10*3/uL (ref 0.0–0.5)
Eosinophils Relative: 4 %
HCT: 44.6 % (ref 36.0–46.0)
Hemoglobin: 15 g/dL (ref 12.0–15.0)
Immature Granulocytes: 1 %
Lymphocytes Relative: 41 %
Lymphs Abs: 3.5 10*3/uL (ref 0.7–4.0)
MCH: 30.4 pg (ref 26.0–34.0)
MCHC: 33.6 g/dL (ref 30.0–36.0)
MCV: 90.3 fL (ref 80.0–100.0)
Monocytes Absolute: 0.7 10*3/uL (ref 0.1–1.0)
Monocytes Relative: 9 %
Neutro Abs: 3.9 10*3/uL (ref 1.7–7.7)
Neutrophils Relative %: 44 %
Platelets: 297 10*3/uL (ref 150–400)
RBC: 4.94 MIL/uL (ref 3.87–5.11)
RDW: 12.1 % (ref 11.5–15.5)
WBC: 8.5 10*3/uL (ref 4.0–10.5)
nRBC: 0 % (ref 0.0–0.2)

## 2024-05-05 LAB — LACTATE DEHYDROGENASE: LDH: 134 U/L (ref 98–192)

## 2024-05-05 NOTE — Progress Notes (Signed)
 Would like to review CT scans that were done on 6/8

## 2024-05-05 NOTE — Progress Notes (Signed)
 [facility]  Telephone:([phone] [phone]  ID: Cecille Coffin OB: December 06, 1980  MR#: [id number]  AOZ#:[id number]  Patient Care Team: Sowles, Krichna, MD as PCP - General (Family Medicine) Shellie Dials, MD as Consulting Physician (Oncology)  CHIEF COMPLAINT: Lymphadenopathy.  INTERVAL HISTORY: Patient is a 43 year old female who recently presented to the emergency room with multiple complaints that were new in onset about a week prior to visit.  She also reported some enlarged lymph nodes in her groin.  Subsequent imaging confirmed bilateral inguinal lymphadenopathy.  Currently, patient feels improved and nearly back to her baseline.  She has no neurologic complaints.  She denies any fevers.  She has a good appetite and denies weight loss.  She has no chest pain, shortness of breath, cough, or hemoptysis.  She denies any nausea, vomiting, constipation, or diarrhea.  She has no urinary complaints.  Patient offers no further specific complaints today.  REVIEW OF SYSTEMS:   Review of Systems  Constitutional: Negative.  Negative for fever, malaise/fatigue and weight loss.  Respiratory: Negative.  Negative for cough, hemoptysis and shortness of breath.   Cardiovascular: Negative.  Negative for chest pain and leg swelling.  Gastrointestinal: Negative.  Negative for abdominal pain.  Genitourinary: Negative.  Negative for dysuria.  Musculoskeletal: Negative.  Negative for back pain.  Neurological: Negative.  Negative for dizziness, focal weakness, weakness and headaches.  Psychiatric/Behavioral: Negative.  The patient is not nervous/anxious.     As per HPI. Otherwise, a complete review of systems is negative.  PAST MEDICAL HISTORY: Past Medical History:  Diagnosis Date   Abnormal Pap smear of cervix    Blood type A-    BMI 31.0-31.9,adult    CIN III (cervical intraepithelial neoplasia III)    Pap smear of cervix with ASCUS, cannot exclude HGSIL  01/05/2015    PAST SURGICAL HISTORY: Past Surgical History:  Procedure Laterality Date   COLPOSCOPY  01/17/2015   High grade squamous intraepithelial lesion    LEEP  10/21/2015   Ectocervix, Leep pathology showed high-grade intraepithelial lesion, margins appear to be free   nexplanon   11/25/2015    FAMILY HISTORY: Family History  Problem Relation Age of Onset   Hyperlipidemia Mother    Diabetes type II Paternal Grandmother    Diabetes type II Father     ADVANCED DIRECTIVES (Y/N):  N  HEALTH MAINTENANCE: Social History   Tobacco Use   Smoking status: Every Day    Current packs/day: 0.25    Average packs/day: 0.3 packs/day for 20.0 years (5.0 ttl pk-yrs)    Types: Cigarettes   Smokeless tobacco: Never  Vaping Use   Vaping status: Former  Substance Use Topics   Alcohol use: No    Alcohol/week: 0.0 standard drinks of alcohol   Drug use: No     Colonoscopy:  PAP:  Bone density:  Lipid panel:  No Known Allergies  Current Outpatient Medications  Medication Sig Dispense Refill   amoxicillin -clavulanate (AUGMENTIN ) 875-125 MG tablet Take 1 tablet by mouth 2 (two) times daily for 10 days. 20 tablet 0   econazole nitrate 1 % cream Apply topically 2 (two) times daily.     levonorgestrel  (MIRENA ) 20 MCG/DAY IUD 1 each by Intrauterine route once for 1 dose. 1 each 0   terbinafine (LAMISIL) 250 MG tablet Take by mouth.     lidocaine  (LIDODERM ) 5 % Place 1 patch onto the skin daily for 10 days. Remove & Discard patch within 12 hours or as directed by  MD (Patient not taking: Reported on 05/05/2024) 10 patch 0   No current facility-administered medications for this visit.    OBJECTIVE: Vitals:   05/05/24 1343  BP: 115/85  Pulse: 81  Resp: 18  Temp: (!) 97.5 F (36.4 C)  SpO2: 99%     Body mass index is 32.26 kg/m.    ECOG FS:0 - Asymptomatic  General: Well-developed, well-nourished, no acute distress. Eyes: Pink conjunctiva, anicteric sclera. HEENT:  Normocephalic, moist mucous membranes. Lungs: No audible wheezing or coughing. Heart: Regular rate and rhythm. Abdomen: Soft, nontender, no obvious distention. Musculoskeletal: No edema, cyanosis, or clubbing. Neuro: Alert, answering all questions appropriately. Cranial nerves grossly intact. Skin: No rashes or petechiae noted. Psych: Normal affect. Lymphatics: No cervical, calvicular, axillary or inguinal LAD.   LAB RESULTS:  Lab Results  Component Value Date   NA 136 04/26/2024   K 3.9 04/26/2024   CL 104 04/26/2024   CO2 22 04/26/2024   GLUCOSE 110 (H) 04/26/2024   BUN 13 04/26/2024   CREATININE 0.88 04/26/2024   CALCIUM 9.3 04/26/2024   PROT 7.7 04/26/2024   ALBUMIN 4.0 04/26/2024   AST 32 04/26/2024   ALT 61 (H) 04/26/2024   ALKPHOS 46 04/26/2024   BILITOT 0.7 04/26/2024   GFRNONAA >60 04/26/2024    Lab Results  Component Value Date   WBC 8.5 05/05/2024   NEUTROABS 3.9 05/05/2024   HGB 15.0 05/05/2024   HCT 44.6 05/05/2024   MCV 90.3 05/05/2024   PLT 297 05/05/2024     STUDIES: DG Chest Portable 1 View Result Date: 04/26/2024 CLINICAL DATA:  Fever EXAM: PORTABLE CHEST 1 VIEW COMPARISON:  None Available. FINDINGS: The heart size and mediastinal contours are within normal limits. Both lungs are clear. The visualized skeletal structures are unremarkable. IMPRESSION: Normal study. Electronically Signed   By: Janeece Mechanic M.D.   On: 04/26/2024 21:18   CT ABDOMEN PELVIS W CONTRAST Result Date: 04/26/2024 CLINICAL DATA:  Acute nonlocalized abdominal pain EXAM: CT ABDOMEN AND PELVIS WITH CONTRAST TECHNIQUE: Multidetector CT imaging of the abdomen and pelvis was performed using the standard protocol following bolus administration of intravenous contrast. RADIATION DOSE REDUCTION: This exam was performed according to the departmental dose-optimization program which includes automated exposure control, adjustment of the mA and/or kV according to patient size and/or use of  iterative reconstruction technique. CONTRAST:  OMNIPAQUE  IOHEXOL  300 MG/ML  SOLN COMPARISON:  None Available. FINDINGS: Lower chest: No acute abnormality. Hepatobiliary: Mild hepatic steatosis. Normal gallbladder. No biliary dilation. Pancreas: Unremarkable. Spleen: Unremarkable. Adrenals/Urinary Tract: Normal adrenal glands. No urinary calculi or hydronephrosis. Bladder is unremarkable. Stomach/Bowel: Normal caliber large and small bowel. No bowel wall thickening. The appendix is normal.Stomach is within normal limits. Vascular/Lymphatic: No significant vascular findings are present. Enlarged left inguinal lymph nodes measuring 2.0 x 1.9 cm and 2.1 x 1.7 cm, respectively (series 2/image 98 and 100). Additional left external iliac lymphadenopathy with multiple nodes measuring up to 1.0 cm (series 2/image 74-78). Additional subcentimeter right inguinal and right iliac lymph nodes. Reproductive: IUD in expected position within the uterus. 3.6 cm left adnexal cyst. This does not require follow-up. Unremarkable right adnexa. Other: No free intraperitoneal fluid or air. Musculoskeletal: No acute fracture. IMPRESSION: 1. No acute abnormality in the abdomen or pelvis. 2. Left inguinal and external iliac lymphadenopathy. If the patient has a history of malignancy ultrasound-guided biopsy should be considered to exclude nodal metastases. Otherwise correlation with clinical and laboratory evidence of lymphoproliferative disorder and three-month follow-up CT  is recommended. 3. Mild hepatic steatosis. Electronically Signed   By: Rozell Cornet M.D.   On: 04/26/2024 21:04   CT HEAD WO CONTRAST ( ) Result Date: 04/26/2024 CLINICAL DATA:  Head trauma EXAM: CT HEAD WITHOUT CONTRAST TECHNIQUE: Contiguous axial images were obtained from the base of the skull through the vertex without intravenous contrast. RADIATION DOSE REDUCTION: This exam was performed according to the departmental dose-optimization program which  includes automated exposure control, adjustment of the mA and/or kV according to patient size and/or use of iterative reconstruction technique. COMPARISON:  None Available. FINDINGS: Brain: No mass,hemorrhage or extra-axial collection. Normal appearance of the parenchyma and CSF spaces. Vascular: No hyperdense vessel or unexpected vascular calcification. Skull: The visualized skull base, calvarium and extracranial soft tissues are normal. Sinuses/Orbits: No fluid levels or advanced mucosal thickening of the visualized paranasal sinuses. No mastoid or middle ear effusion. Normal orbits. Other: None. IMPRESSION: Normal head CT. Electronically Signed   By: Juanetta Nordmann M.D.   On: 04/26/2024 20:55    ASSESSMENT: Lymphadenopathy.  PLAN:    Lymphadenopathy: Patient noted to have enlarged left inguinal lymph nodes measuring 2.0 x 1.9 cm and 2.1 x 1.7 cm.  She is also noted to have additional subcentimeter right inguinal and right iliac lymph nodes.  Unclear etiology.  Will get a PET scan to further evaluate.  If negative, no further workup is necessary.  If positive, patient will likely require ultrasound-guided biopsy.  Peripheral blood flow cytometry was ordered for completeness and pending at time of dictation.  Patient will follow-up 1 week after PET scan with video-assisted telemedicine visit. Leukocytosis: Resolved.  I spent a total of 45 minutes reviewing chart data, face-to-face evaluation with the patient, counseling and coordination of care as detailed above.   Patient expressed understanding and was in agreement with this plan. She also understands that She can call clinic at any time with any questions, concerns, or complaints.    Cancer Staging  No matching staging information was found for the patient.   Shellie Dials, MD   05/05/2024 4:59 PM

## 2024-05-07 LAB — COMP PANEL: LEUKEMIA/LYMPHOMA

## 2024-05-12 ENCOUNTER — Ambulatory Visit
Admission: RE | Admit: 2024-05-12 | Discharge: 2024-05-12 | Disposition: A | Discharge status: Home / Self Care | Source: Ambulatory Visit | Attending: Oncology | Admitting: Oncology

## 2024-05-12 DIAGNOSIS — N83201 Unspecified ovarian cyst, right side: Secondary | ICD-10-CM | POA: Insufficient documentation

## 2024-05-12 DIAGNOSIS — M545 Low back pain, unspecified: Secondary | ICD-10-CM | POA: Insufficient documentation

## 2024-05-12 DIAGNOSIS — R591 Generalized enlarged lymph nodes: Secondary | ICD-10-CM | POA: Diagnosis present

## 2024-05-12 DIAGNOSIS — K760 Fatty (change of) liver, not elsewhere classified: Secondary | ICD-10-CM | POA: Insufficient documentation

## 2024-05-12 DIAGNOSIS — E119 Type 2 diabetes mellitus without complications: Secondary | ICD-10-CM | POA: Insufficient documentation

## 2024-05-12 DIAGNOSIS — N83202 Unspecified ovarian cyst, left side: Secondary | ICD-10-CM | POA: Insufficient documentation

## 2024-05-12 LAB — GLUCOSE, CAPILLARY: Glucose-Capillary: 91 mg/dL (ref 70–99)

## 2024-05-12 MED ORDER — FLUDEOXYGLUCOSE F - 18 (FDG) INJECTION
10.7000 | Freq: Once | INTRAVENOUS | Status: AC | PRN
  Administered 2024-05-12: 11.36 via INTRAVENOUS

## 2024-05-14 ENCOUNTER — Inpatient Hospital Stay: Admitting: Oncology

## 2024-05-14 DIAGNOSIS — Z862 Personal history of diseases of the blood and blood-forming organs and certain disorders involving the immune mechanism: Secondary | ICD-10-CM

## 2024-05-14 DIAGNOSIS — R591 Generalized enlarged lymph nodes: Secondary | ICD-10-CM

## 2024-05-14 NOTE — Progress Notes (Signed)
 [facility]  [phone] [phone]) [phone]  ID: Jacqueline Baldwin OB: 13-Jul-1981  MR#: [id number]  RDW#:[id number]  Patient Care Team: Sowles, Krichna, MD as PCP - General (Family Medicine) Jacobo Evalene PARAS, MD as Consulting Physician (Oncology)  I connected with Jacqueline Baldwin on 05/14/24 at 10:45 AM EDT by video enabled telemedicine visit and verified that I am speaking with the correct person using two identifiers.   I discussed the limitations, risks, security and privacy concerns of performing an evaluation and management service by telemedicine and the availability of in-person appointments. I also discussed with the patient that there may be a patient responsible charge related to this service. The patient expressed understanding and agreed to proceed.   Other persons participating in the visit and their role in the encounter: Patient, MD.  Patient's location: Home. Provider's location: Clinic.  CHIEF COMPLAINT: Lymphadenopathy.  INTERVAL HISTORY: Patient agreed to video-assisted telemedicine visit for further evaluation and discussion of her PET scan results.  She currently feels well and is asymptomatic.  She has no neurologic complaints.  She denies any fevers.  She has a good appetite and denies weight loss.  She has no chest pain, shortness of breath, cough, or hemoptysis.  She denies any nausea, vomiting, constipation, or diarrhea.  She has no urinary complaints.  Patient offers no specific complaints today.  REVIEW OF SYSTEMS:   Review of Systems  Constitutional: Negative.  Negative for fever, malaise/fatigue and weight loss.  Respiratory: Negative.  Negative for cough, hemoptysis and shortness of breath.   Cardiovascular: Negative.  Negative for chest pain and leg swelling.  Gastrointestinal: Negative.  Negative for abdominal pain.  Genitourinary: Negative.  Negative for dysuria.  Musculoskeletal: Negative.  Negative for back pain.   Neurological: Negative.  Negative for dizziness, focal weakness, weakness and headaches.  Psychiatric/Behavioral: Negative.  The patient is not nervous/anxious.     As per HPI. Otherwise, a complete review of systems is negative.  PAST MEDICAL HISTORY: Past Medical History:  Diagnosis Date   Abnormal Pap smear of cervix    Blood type A-    BMI 31.0-31.9,adult    CIN III (cervical intraepithelial neoplasia III)    Pap smear of cervix with ASCUS, cannot exclude HGSIL 01/05/2015    PAST SURGICAL HISTORY: Past Surgical History:  Procedure Laterality Date   COLPOSCOPY  01/17/2015   High grade squamous intraepithelial lesion    LEEP  10/21/2015   Ectocervix, Leep pathology showed high-grade intraepithelial lesion, margins appear to be free   nexplanon   11/25/2015    FAMILY HISTORY: Family History  Problem Relation Age of Onset   Hyperlipidemia Mother    Diabetes type II Paternal Grandmother    Diabetes type II Father     ADVANCED DIRECTIVES (Y/N):  N  HEALTH MAINTENANCE: Social History   Tobacco Use   Smoking status: Every Day    Current packs/day: 0.25    Average packs/day: 0.3 packs/day for 20.0 years (5.0 ttl pk-yrs)    Types: Cigarettes   Smokeless tobacco: Never  Vaping Use   Vaping status: Former  Substance Use Topics   Alcohol use: No    Alcohol/week: 0.0 standard drinks of alcohol   Drug use: No     Colonoscopy:  PAP:  Bone density:  Lipid panel:  No Known Allergies  Current Outpatient Medications  Medication Sig Dispense Refill   econazole nitrate 1 % cream Apply topically 2 (two) times daily.     levonorgestrel  (MIRENA ) 20 MCG/DAY IUD  1 each by Intrauterine route once for 1 dose. 1 each 0   terbinafine (LAMISIL) 250 MG tablet Take by mouth.     No current facility-administered medications for this visit.    OBJECTIVE: There were no vitals filed for this visit.    There is no height or weight on file to calculate BMI.    ECOG FS:0 -  Asymptomatic  General: Well-developed, well-nourished, no acute distress. HEENT: Normocephalic. Neuro: Alert, answering all questions appropriately. Cranial nerves grossly intact. Psych: Normal affect.  LAB RESULTS:  Lab Results  Component Value Date   NA 136 04/26/2024   K 3.9 04/26/2024   CL 104 04/26/2024   CO2 22 04/26/2024   GLUCOSE 110 (H) 04/26/2024   BUN 13 04/26/2024   CREATININE 0.88 04/26/2024   CALCIUM 9.3 04/26/2024   PROT 7.7 04/26/2024   ALBUMIN 4.0 04/26/2024   AST 32 04/26/2024   ALT 61 (H) 04/26/2024   ALKPHOS 46 04/26/2024   BILITOT 0.7 04/26/2024   GFRNONAA >60 04/26/2024    Lab Results  Component Value Date   WBC 8.5 05/05/2024   NEUTROABS 3.9 05/05/2024   HGB 15.0 05/05/2024   HCT 44.6 05/05/2024   MCV 90.3 05/05/2024   PLT 297 05/05/2024     STUDIES: NM PET Image Initial (PI) Skull Base To Thigh Result Date: 05/13/2024 CLINICAL DATA:  Initial treatment strategy for lymphadenopathy. EXAM: NUCLEAR MEDICINE PET SKULL BASE TO THIGH TECHNIQUE: 11.36 mCi F-18 FDG was injected intravenously. Full-ring PET imaging was performed from the skull base to thigh after the radiotracer. CT data was obtained and used for attenuation correction and anatomic localization. Fasting blood glucose: 91 mg/dl COMPARISON:  Abdomen pelvis CT 04/26/2024.  Chest x-ray 04/26/2024. FINDINGS: Mediastinal blood pool activity: SUV max 2.3 Liver activity: SUV max 2.6 NECK: There is no specific abnormal uptake seen in the neck including along lymph node change of the submandibular, posterior triangle or internal jugular region. There is symmetric uptake of the intracranial compartment included in the imaging field. Incidental CT findings: The parotid glands, submandibular glands and thyroid  gland are unremarkable. Visualized portions of the paranasal sinuses and mastoid air cells are clear except for some partial opacity along the anterior ethmoid air cells. There is a tongue bar in  place. CHEST: Slight uptake along the lower esophagus. The esophagus is slightly patulous. Please correlate with any symptoms. The no specific abnormal uptake above blood pool in the axillary regions, hilum or mediastinum. No abnormal lung uptake. Incidental CT findings: Heart is not enlarged. No pericardial effusion. The thoracic aorta is normal course and caliber. Pulsation artifact. Bovine type aortic arch, a normal variant. Breathing motion. Mild dependent areas of atelectasis and ground-glass. No consolidation, pneumothorax or effusion. ABDOMEN/PELVIS: There is physiologic distribution radiotracer along the parenchymal organs, bowel and renal collecting systems. On the prior there were some enlarged left inguinal nodes. These are again seen. Example previously measured 21 x 17 mm. And today this same node on image 162 of series 6 measures 18 by 13 mm and has maximum SUV of 3.4. Other nodes are also slightly smaller. These could be reactive. Please correlate for any clinical history. Incidental CT findings: IUD again seen in the uterus. The left adnexal cystic lesion, likely ovarian which previously measured diameter of 3.6 cm, today 3.2 cm on image 143 and does not show any abnormal uptake. This could be a functional cyst. Smaller cystic focus in the right adnexa is also stable and without uptake. Elsewhere on  this noncontrast, attenuation correction CT, the large bowel has a normal course and caliber. Scattered stool. Normal retrocecal appendix. The stomach and small bowel are nondilated. Fatty liver infiltration. Distended gallbladder. Spleen is not enlarged. Pancreas, adrenal glands are preserved. No abnormal calcifications seen within either kidney nor along the course either ureter. The urinary bladder is underdistended. No significant free fluid. SKELETON: No specific abnormal uptake along the visualized osseous structures. Incidental CT findings: Mild scattered degenerative changes. IMPRESSION: The  previous enlarged a left inguinal lymph nodes show some reduction in size since the prior study of earlier in June 2025. The uptake is minimal. These very well could be reactive. Please correlate with any clinical history. Simple follow up ultrasound could be performed in 12 weeks to confirm etiology. No additional areas of abnormal radiotracer uptake. No other nodal uptake. No splenic uptake. Fatty liver infiltration. Bilateral ovarian cystic foci are again noted and do not show any abnormal uptake. Likely functional cysts. IUD. Electronically Signed   By: Ranell Bring M.D.   On: 05/13/2024 17:20   DG Chest Portable 1 View Result Date: 04/26/2024 CLINICAL DATA:  Fever EXAM: PORTABLE CHEST 1 VIEW COMPARISON:  None Available. FINDINGS: The heart size and mediastinal contours are within normal limits. Both lungs are clear. The visualized skeletal structures are unremarkable. IMPRESSION: Normal study. Electronically Signed   By: Franky Crease M.D.   On: 04/26/2024 21:18   CT ABDOMEN PELVIS W CONTRAST Result Date: 04/26/2024 CLINICAL DATA:  Acute nonlocalized abdominal pain EXAM: CT ABDOMEN AND PELVIS WITH CONTRAST TECHNIQUE: Multidetector CT imaging of the abdomen and pelvis was performed using the standard protocol following bolus administration of intravenous contrast. RADIATION DOSE REDUCTION: This exam was performed according to the departmental dose-optimization program which includes automated exposure control, adjustment of the mA and/or kV according to patient size and/or use of iterative reconstruction technique. CONTRAST:  OMNIPAQUE  IOHEXOL  300 MG/ML  SOLN COMPARISON:  None Available. FINDINGS: Lower chest: No acute abnormality. Hepatobiliary: Mild hepatic steatosis. Normal gallbladder. No biliary dilation. Pancreas: Unremarkable. Spleen: Unremarkable. Adrenals/Urinary Tract: Normal adrenal glands. No urinary calculi or hydronephrosis. Bladder is unremarkable. Stomach/Bowel: Normal caliber large and  small bowel. No bowel wall thickening. The appendix is normal.Stomach is within normal limits. Vascular/Lymphatic: No significant vascular findings are present. Enlarged left inguinal lymph nodes measuring 2.0 x 1.9 cm and 2.1 x 1.7 cm, respectively (series 2/image 98 and 100). Additional left external iliac lymphadenopathy with multiple nodes measuring up to 1.0 cm (series 2/image 74-78). Additional subcentimeter right inguinal and right iliac lymph nodes. Reproductive: IUD in expected position within the uterus. 3.6 cm left adnexal cyst. This does not require follow-up. Unremarkable right adnexa. Other: No free intraperitoneal fluid or air. Musculoskeletal: No acute fracture. IMPRESSION: 1. No acute abnormality in the abdomen or pelvis. 2. Left inguinal and external iliac lymphadenopathy. If the patient has a history of malignancy ultrasound-guided biopsy should be considered to exclude nodal metastases. Otherwise correlation with clinical and laboratory evidence of lymphoproliferative disorder and three-month follow-up CT is recommended. 3. Mild hepatic steatosis. Electronically Signed   By: Norman Gatlin M.D.   On: 04/26/2024 21:04   CT HEAD WO CONTRAST ( ) Result Date: 04/26/2024 CLINICAL DATA:  Head trauma EXAM: CT HEAD WITHOUT CONTRAST TECHNIQUE: Contiguous axial images were obtained from the base of the skull through the vertex without intravenous contrast. RADIATION DOSE REDUCTION: This exam was performed according to the departmental dose-optimization program which includes automated exposure control, adjustment of the mA and/or  kV according to patient size and/or use of iterative reconstruction technique. COMPARISON:  None Available. FINDINGS: Brain: No mass,hemorrhage or extra-axial collection. Normal appearance of the parenchyma and CSF spaces. Vascular: No hyperdense vessel or unexpected vascular calcification. Skull: The visualized skull base, calvarium and extracranial soft tissues are normal.  Sinuses/Orbits: No fluid levels or advanced mucosal thickening of the visualized paranasal sinuses. No mastoid or middle ear effusion. Normal orbits. Other: None. IMPRESSION: Normal head CT. Electronically Signed   By: Franky Stanford M.D.   On: 04/26/2024 20:55    ASSESSMENT: Lymphadenopathy.  PLAN:    Lymphadenopathy: Patient noted to have enlarged left inguinal lymph nodes measuring 2.0 x 1.9 cm and 2.1 x 1.7 cm.  She is also noted to have additional subcentimeter right inguinal and right iliac lymph nodes.  PET scan results from May 12, 2024 reviewed independently and reported as above with decrease in size and lymph nodes as well as minimal hypermetabolism likely representing a reactive process.  No further interventions are needed.  No follow-up is necessary.   Leukocytosis: Resolved.  Peripheral blood flow cytometry was within normal limits.  I provided 20 minutes of face-to-face video visit time during this encounter which included chart review, counseling, and coordination of care as documented above.   Patient expressed understanding and was in agreement with this plan. She also understands that She can call clinic at any time with any questions, concerns, or complaints.    Evalene JINNY Reusing, MD   05/14/2024 11:04 AM

## 2024-09-16 ENCOUNTER — Other Ambulatory Visit: Payer: Self-pay | Admitting: Obstetrics and Gynecology

## 2024-09-16 DIAGNOSIS — Z1231 Encounter for screening mammogram for malignant neoplasm of breast: Secondary | ICD-10-CM

## 2024-09-17 ENCOUNTER — Ambulatory Visit
Admission: RE | Admit: 2024-09-17 | Discharge: 2024-09-17 | Disposition: A | Discharge status: Home / Self Care | Source: Ambulatory Visit | Attending: Obstetrics and Gynecology | Admitting: Obstetrics and Gynecology

## 2024-09-17 DIAGNOSIS — Z1231 Encounter for screening mammogram for malignant neoplasm of breast: Secondary | ICD-10-CM

## 2024-09-22 ENCOUNTER — Ambulatory Visit: Payer: Self-pay | Admitting: Obstetrics and Gynecology
